# Patient Record
Sex: Female | Born: 1965 | Race: Black or African American | Hispanic: No | Marital: Married | Smoking: Former smoker
Health system: Southern US, Community
[De-identification: ages and names within clinical notes are randomized; demographics above are authoritative.]

## PROBLEM LIST (undated history)

## (undated) DIAGNOSIS — E049 Nontoxic goiter, unspecified: Secondary | ICD-10-CM

## (undated) DIAGNOSIS — E042 Nontoxic multinodular goiter: Secondary | ICD-10-CM

## (undated) DIAGNOSIS — E669 Obesity, unspecified: Secondary | ICD-10-CM

## (undated) DIAGNOSIS — Z720 Tobacco use: Secondary | ICD-10-CM

## (undated) DIAGNOSIS — R079 Chest pain, unspecified: Secondary | ICD-10-CM

## (undated) DIAGNOSIS — IMO0002 Reserved for concepts with insufficient information to code with codable children: Secondary | ICD-10-CM

## (undated) DIAGNOSIS — M25751 Osteophyte, right hip: Secondary | ICD-10-CM

## (undated) DIAGNOSIS — M5431 Sciatica, right side: Secondary | ICD-10-CM

## (undated) DIAGNOSIS — F17200 Nicotine dependence, unspecified, uncomplicated: Secondary | ICD-10-CM

## (undated) DIAGNOSIS — E66811 Obesity, class 1: Secondary | ICD-10-CM

## (undated) DIAGNOSIS — M65332 Trigger finger, left middle finger: Secondary | ICD-10-CM

## (undated) DIAGNOSIS — R011 Cardiac murmur, unspecified: Secondary | ICD-10-CM

## (undated) DIAGNOSIS — IMO0001 Reserved for inherently not codable concepts without codable children: Secondary | ICD-10-CM

## (undated) DIAGNOSIS — E041 Nontoxic single thyroid nodule: Secondary | ICD-10-CM

## (undated) DIAGNOSIS — E119 Type 2 diabetes mellitus without complications: Secondary | ICD-10-CM

## (undated) DIAGNOSIS — E7800 Pure hypercholesterolemia, unspecified: Secondary | ICD-10-CM

## (undated) DIAGNOSIS — I10 Essential (primary) hypertension: Secondary | ICD-10-CM

## (undated) DIAGNOSIS — E079 Disorder of thyroid, unspecified: Secondary | ICD-10-CM

## (undated) DIAGNOSIS — E559 Vitamin D deficiency, unspecified: Secondary | ICD-10-CM

## (undated) DIAGNOSIS — R030 Elevated blood-pressure reading, without diagnosis of hypertension: Secondary | ICD-10-CM

## (undated) DIAGNOSIS — E1165 Type 2 diabetes mellitus with hyperglycemia: Secondary | ICD-10-CM

## (undated) DIAGNOSIS — M65312 Trigger thumb, left thumb: Secondary | ICD-10-CM

## (undated) HISTORY — DX: Osteophyte, right hip: M25.751

## (undated) HISTORY — PX: FACIAL COSMETIC SURGERY: SHX629

## (undated) HISTORY — DX: Chest pain, unspecified: R07.9

## (undated) HISTORY — DX: Sciatica, right side: M54.31

## (undated) HISTORY — DX: Nontoxic goiter, unspecified: E04.9

## (undated) HISTORY — DX: Reserved for inherently not codable concepts without codable children: IMO0001

## (undated) HISTORY — DX: Tobacco use: Z72.0

## (undated) HISTORY — DX: Obesity, class 1: E66.811

## (undated) HISTORY — DX: Type 2 diabetes mellitus with hyperglycemia: E11.65

## (undated) HISTORY — DX: Nontoxic multinodular goiter: E04.2

## (undated) HISTORY — DX: Essential (primary) hypertension: I10

## (undated) HISTORY — DX: Disorder of thyroid, unspecified: E07.9

## (undated) HISTORY — DX: Obesity, unspecified: E66.9

## (undated) HISTORY — DX: Reserved for concepts with insufficient information to code with codable children: IMO0002

## (undated) HISTORY — DX: Vitamin D deficiency, unspecified: E55.9

## (undated) HISTORY — DX: Nontoxic single thyroid nodule: E04.1

## (undated) HISTORY — DX: Pure hypercholesterolemia, unspecified: E78.00

## (undated) HISTORY — DX: Cardiac murmur, unspecified: R01.1

## (undated) HISTORY — PX: ECTOPIC PREGNANCY SURGERY: SHX613

## (undated) HISTORY — DX: Elevated blood-pressure reading, without diagnosis of hypertension: R03.0

---
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
## 2012-11-15 ENCOUNTER — Encounter (HOSPITAL_COMMUNITY): Payer: Self-pay | Admitting: Emergency Medicine

## 2012-11-15 ENCOUNTER — Emergency Department (HOSPITAL_COMMUNITY)
Admission: EM | Admit: 2012-11-15 | Discharge: 2012-11-15 | Disposition: A | Discharge status: Home / Self Care | Payer: Medicaid - Out of State | Attending: Emergency Medicine | Admitting: Emergency Medicine

## 2012-11-15 DIAGNOSIS — M62830 Muscle spasm of back: Secondary | ICD-10-CM

## 2012-11-15 DIAGNOSIS — F17200 Nicotine dependence, unspecified, uncomplicated: Secondary | ICD-10-CM | POA: Insufficient documentation

## 2012-11-15 DIAGNOSIS — M5380 Other specified dorsopathies, site unspecified: Secondary | ICD-10-CM | POA: Insufficient documentation

## 2012-11-15 MED ORDER — HYDROCODONE-ACETAMINOPHEN 5-325 MG PO TABS: 1.0000 | ORAL_TABLET | ORAL | Status: DC | PRN

## 2012-11-15 MED ORDER — DIAZEPAM 5 MG PO TABS: 5.0000 mg | ORAL_TABLET | Freq: Four times a day (QID) | ORAL | Status: DC | PRN

## 2012-11-15 NOTE — ED Notes (Signed)
PT. REPORTS INTERMITTENT LOW BACK  PAIN FOR 2 DAYS AFTER LIFTING A SUITCASE , DENIES FALL , AMBULATORY , NO URINARY DISCOMFORT OR HEMATURIA . PAIN WORSE WITH MOVEMENT / CERTAIN POSITIONS .

## 2012-11-15 NOTE — ED Provider Notes (Signed)
CSN: [id number]     Arrival date & time 11/15/12  1851 History   First MD Initiated Contact with Patient 11/15/12 2010     Chief Complaint  Patient presents with  . Back Pain   HPI  History provided by the patient. The patient is a 47 year old female with no significant PMH who presents with complaints of right lower back pain for the past 2 days. Patient states she had some increased activity with traveling and packing. She was having some soreness in her back but nothing significant. 2 days ago she was lifting her suitcase and twisting slightly with a much sharper pain in "twinge"in her lower back more on the right side. Since then she has had worsening pains with frequent sharp intense pains during movements and positions. She states it makes it very difficult to move and function. If she was using ibuprofen and has used topical icy hot patches. These initially helped briefly but she is not having any relief now. Pain does not radiate. Denies any weakness or numbness in lower extremities. No urinary or fecal incontinence, urinary retention or perineal numbness. No other aggravating or alleviating factors. No other associated symptoms.    History reviewed. No pertinent past medical history. Past Surgical History  Procedure Laterality Date  . Facial cosmetic surgery     No family history on file. History  Substance Use Topics  . Smoking status: Current Every Day Smoker  . Smokeless tobacco: Not on file  . Alcohol Use: No   OB History   Grav Para Term Preterm Abortions TAB SAB Ect Mult Living                 Review of Systems  Constitutional: Negative for fever and unexpected weight change.  Respiratory: Negative for shortness of breath.   Cardiovascular: Negative for chest pain.  Gastrointestinal: Negative for abdominal pain.  Genitourinary: Negative for dysuria, frequency, hematuria and flank pain.  Musculoskeletal: Positive for back pain.  Neurological: Negative for weakness  and numbness.  All other systems reviewed and are negative.    Allergies  Review of patient's allergies indicates not on file.  Home Medications   Current Outpatient Rx  Name  Route  Sig  Dispense  Refill  . diazepam (VALIUM) 5 MG tablet   Oral   Take 1 tablet (5 mg total) by mouth every 6 (six) hours as needed for anxiety (spasms).   10 tablet   0   . HYDROcodone-acetaminophen (NORCO) 5-325 MG per tablet   Oral   Take 1 tablet by mouth every 4 (four) hours as needed for pain.   10 tablet   0    BP 126/72  Pulse 75  Temp(Src) 97.9 F (36.6 C) (Oral)  Resp 16  SpO2 97% Physical Exam  Nursing note and vitals reviewed. Constitutional: She is oriented to person, place, and time. She appears well-developed and well-nourished. No distress.  HENT:  Head: Normocephalic.  Cardiovascular: Normal rate and regular rhythm.   Pulmonary/Chest: Effort normal and breath sounds normal. No respiratory distress. She has no wheezes. She has no rales.  Abdominal: Soft. There is no tenderness. There is no rebound and no guarding.  Musculoskeletal: Normal range of motion. She exhibits no edema and no tenderness.       Lumbar back: She exhibits no bony tenderness and no deformity.       Back:  Negative straight leg test  Neurological: She is alert and oriented to person, place, and time.  Skin: Skin is warm and dry. No rash noted.  Psychiatric: She has a normal mood and affect. Her behavior is normal.    ED Course  Procedures  Labs Review Labs Reviewed - No data to display Imaging Review No results found.  MDM   1. Muscle spasm of back    Patient seen and evaluated. The patient appears well in no acute distress. Does not appear in significant pain or discomfort. Patient with no red flag symptoms. No concerning injury or trauma. No indications for imaging at this time. Symptoms most consistent with muscle strain and intermittent spasms    Angus Seller, PA-C 11/15/12 2219

## 2012-11-16 NOTE — ED Provider Notes (Signed)
Medical screening examination/treatment/procedure(s) were performed by non-physician practitioner and as supervising physician I was immediately available for consultation/collaboration.   Audree Camel, MD 11/16/12 [phone]

## 2014-01-17 ENCOUNTER — Encounter (HOSPITAL_COMMUNITY): Payer: Self-pay | Admitting: Emergency Medicine

## 2014-01-17 ENCOUNTER — Emergency Department (HOSPITAL_COMMUNITY)
Admission: EM | Admit: 2014-01-17 | Discharge: 2014-01-17 | Disposition: A | Discharge status: Home / Self Care | Payer: No Typology Code available for payment source | Source: Home / Self Care | Attending: Family Medicine | Admitting: Family Medicine

## 2014-01-17 DIAGNOSIS — B370 Candidal stomatitis: Secondary | ICD-10-CM

## 2014-01-17 MED ORDER — NYSTATIN 100000 UNIT/ML MT SUSP: 500000.0000 [IU] | Freq: Four times a day (QID) | OROMUCOSAL | Status: DC

## 2014-01-17 NOTE — Discharge Instructions (Signed)
Thrush, Adult  °Thrush, also called oral candidiasis, is a fungal infection that develops in the mouth and throat and on the tongue. It causes white patches to form on the mouth and tongue. Thrush is most common in older adults, but it can occur at any age.  °Many cases of thrush are mild, but this infection can also be more serious. Thrush can be a recurring problem for people who have chronic illnesses or who take medicines that limit the body's ability to fight infection. Because these people have difficulty fighting infections, the fungus that causes thrush can spread throughout the body. This can cause life-threatening blood or organ infections. °CAUSES  °Thrush is usually caused by a yeast called Candida albicans. This fungus is normally present in small amounts in the mouth and on other mucous membranes. It usually causes no harm. However, when conditions are present that allow the fungus to grow uncontrolled, it invades surrounding tissues and becomes an infection. Less often, other Candida species can also lead to thrush.  °RISK FACTORS °Thrush is more likely to develop in the following people: °· People with an impaired ability to fight infection (weakened immune system).   °· Older adults.   °· People with HIV.   °· People with diabetes.   °· People with dry mouth (xerostomia).   °· Pregnant women.   °· People with poor dental care, especially those who have false teeth.   °· People who use antibiotic medicines.   °SIGNS AND SYMPTOMS  °Thrush can be a mild infection that causes no symptoms. If symptoms develop, they may include:  °· A burning feeling in the mouth and throat. This can occur at the start of a thrush infection.   °· White patches that adhere to the mouth and tongue. The tissue around the patches may be red, raw, and painful. If rubbed (during tooth brushing, for example), the patches and the tissue of the mouth may bleed easily.   °· A bad taste in the mouth or difficulty tasting foods.    °· Cottony feeling in the mouth.   °· Pain during eating and swallowing. °DIAGNOSIS  °Your health care provider can usually diagnose thrush by looking in your mouth and asking you questions about your health.  °TREATMENT  °Medicines that help prevent the growth of fungi (antifungals) are the standard treatment for thrush. These medicines are either applied directly to the affected area (topical) or swallowed (oral). The treatment will depend on the severity of the condition.  °Mild Thrush °Mild cases of thrush may clear up with the use of an antifungal mouth rinse or lozenges. Treatment usually lasts about 14 days.  °Moderate to Severe Thrush °· More severe thrush infections that have spread to the esophagus are treated with an oral antifungal medicine. A topical antifungal medicine may also be used.   °· For some severe infections, a treatment period longer than 14 days may be needed.   °· Oral antifungal medicines are almost never used during pregnancy because the fetus may be harmed. However, if a pregnant woman has a rare, severe thrush infection that has spread to her blood, oral antifungal medicines may be used. In this case, the risk of harm to the mother and fetus from the severe thrush infection may be greater than the risk posed by the use of antifungal medicines.   °Persistent or Recurrent Thrush °For cases of thrush that do not go away or keep coming back, treatment may involve the following:  °· Treatment may be needed twice as long as the symptoms last.   °· Treatment will   include both oral and topical antifungal medicines.   People with weakened immune systems can take an antifungal medicine on a continuous basis to prevent thrush infections.  It is important to treat conditions that make you more likely to get thrush, such as diabetes or HIV.  HOME CARE INSTRUCTIONS   Only take over-the-counter or prescription medicine as directed by your health care provider. Talk to your health care  provider about an over-the-counter medicine called gentian violet, which kills bacteria and fungi.   Eat plain, unflavored yogurt as directed by your health care provider. Check the label to make sure the yogurt contains live cultures. This yogurt can help healthy bacteria grow in the mouth that can stop the growth of the fungus that causes thrush.   Try these measures to help reduce the discomfort of thrush:   Drink cold liquids such as water or iced tea.   Try flavored ice treats or frozen juices.   Eat foods that are easy to swallow, such as gelatin, ice cream, or custard.   If the patches in your mouth are painful, try drinking from a straw.   Rinse your mouth several times a day with a warm saltwater rinse. You can make the saltwater mixture with 1 tsp (6 g) of salt in 8 fl oz (0.2 L) of warm water.   If you wear dentures, remove the dentures before going to bed, brush them vigorously, and soak them in a cleaning solution as directed by your health care provider.   Women who are breastfeeding should clean their nipples with an antifungal medicine as directed by their health care provider. Dry the nipples after breastfeeding. Applying lanolin-containing body lotion may help relieve nipple soreness.  SEEK MEDICAL CARE IF:  Your symptoms are getting worse or are not improving within 7 days of starting treatment.   You have symptoms of spreading infection, such as white patches on the skin outside of the mouth.   You are nursing and you have redness, burning, or pain in the nipples that is not relieved with treatment.  MAKE SURE YOU:  Understand these instructions.  Will watch your condition.  Will get help right away if you are not doing well or get worse. Document Released: 12/02/2003 Document Revised: 12/27/2012 Document Reviewed: 10/09/2012 [facility] Patient Information 2015 [place], [place]. This information is not intended to replace advice given to you by your  health care provider. Make sure you discuss any questions you have with your health care provider.  Stomatitis Stomatitis is an inflammation of the mucous lining of the mouth. It can affect part of the mouth or the whole mouth. The intensity of symptoms can range from mild to severe. It can affect your cheek, teeth, gums, lips, or tongue. In almost all cases, the lining of the mouth becomes swollen, red, and painful. Painful ulcers can develop in your mouth. Stomatitis recurs in some people. CAUSES  There are many common causes of stomatitis. They include:  Viruses (such as cold sores or shingles).  Canker sores.  Bacteria (such as ulcerative gingivitis or sexually transmitted diseases).  Fungus or yeast (such as candidiasis or oral thrush).  Poor oral hygiene and poor nutrition (Vincent's stomatitis or trench mouth).  Lack of vitamin B, vitamin C, or niacin.  Dentures or braces that do not fit properly.  High acid foods (uncommon).  Sharp or broken teeth.  Cheek biting.  Breathing through the mouth.  Chewing tobacco.  Allergy to toothpaste, mouthwash, candy, gum, lipstick, or  some medicines.  Burning your mouth with hot drinks or food.  Exposure to dyes, heavy metals, acid fumes, or mineral dust. SYMPTOMS   Painful ulcers in the mouth.  Blisters in the mouth.  Bleeding gums.  Swollen gums.  Irritability.  Bad breath.  Bad taste in the mouth.  Fever.  Trouble eating because of burning and pain in the mouth. DIAGNOSIS  Your caregiver will examine your mouth and look for bleeding gums and mouth ulcers. Your caregiver may ask you about the medicines you are taking. Your caregiver may suggest a blood test and tissue sample (biopsy) of the mouth ulcer or mass if either is present. This will help find the cause of your condition. TREATMENT  Your treatment will depend on the cause of your condition. Your caregiver will first try to treat your symptoms.   You may  be given pain medicine. Topical anesthetic may be used to numb the area if you have severe pain.  Your caregiver may prescribe antibiotic medicine if you have a bacterial infection.  Your caregiver may prescribe antifungal medicine if you have a fungal infection.  You may need to take antiviral medicine if you have a viral infection like herpes.  You may be asked to use medicated mouth rinses.  Your caregiver will advise you about proper brushing and using a soft toothbrush. You also need to get your teeth cleaned regularly. HOME CARE INSTRUCTIONS   Maintain good oral hygiene. This is especially important for transplant patients.  Brush your teeth carefully with a soft, nylon-bristled toothbrush.  Floss at least 2 times a day.  Clean your mouth after eating.  Rinse your mouth with salt water 3 to 4 times a day.  Gargle with cold water.  Use topical numbing medicines to decrease pain if recommended by your caregiver.  Stop smoking, and stop using chewing or smokeless tobacco.  Avoid eating hot and spicy foods.  Eat soft and bland food.  Reduce your stress wherever possible.  Eat healthy and nutritious foods. SEEK MEDICAL CARE IF:   Your symptoms persist or get worse.  You develop new symptoms.  Your mouth ulcers are present for more than 3 weeks.  Your mouth ulcers come back frequently.  You have increasing difficulty with normal eating and drinking.  You have increasing fatigue or weakness.  You develop loss of appetite or nausea. SEEK IMMEDIATE MEDICAL CARE IF:   You have a fever.  You develop pain, redness, or sores around one or both eyes.  You cannot eat or drink because of pain or other symptoms.  You develop worsening weakness, or you faint.  You develop vomiting or diarrhea.  You develop chest pain, shortness of breath, or rapid and irregular heartbeats. MAKE SURE YOU:  Understand these instructions.  Will watch your condition.  Will get  help right away if you are not doing well or get worse. Document Released: 01/03/2007 Document Revised: 05/31/2011 Document Reviewed: 10/15/2010 [facility] Patient Information 2015 [place], [place]. This information is not intended to replace advice given to you by your health care provider. Make sure you discuss any questions you have with your health care provider.

## 2014-01-17 NOTE — ED Provider Notes (Signed)
CSN: [id number]     Arrival date & time 01/17/14  1015 History   First MD Initiated Contact with Patient 01/17/14 1056     Chief Complaint  Patient presents with  . Oral Swelling   (Consider location/radiation/quality/duration/timing/severity/associated sxs/prior Treatment) HPI Comments: 3 days of gumline and upper palate discomfort and irritation. Wears partial upper denture plate and patient states it has been quite uncomfortable for her to wear her dentures. Denies fever, swelling. State she is aware that she has elevated blood pressure and is scheduled to see her PCP on 02/26/2014 to address.  PCP: [facility] Dentist: none  The history is provided by the patient.    History reviewed. No pertinent past medical history. Past Surgical History  Procedure Laterality Date  . Facial cosmetic surgery     History reviewed. No pertinent family history. History  Substance Use Topics  . Smoking status: Current Every Day Smoker  . Smokeless tobacco: Not on file  . Alcohol Use: Yes   OB History   Grav Para Term Preterm Abortions TAB SAB Ect Mult Living                 Review of Systems  All other systems reviewed and are negative.   Allergies  Review of patient's allergies indicates no known allergies.  Home Medications   Prior to Admission medications   Medication Sig Start Date End Date Taking? Authorizing Provider  aspirin 81 MG tablet Take 81 mg by mouth daily.   Yes Historical Provider, MD  diazepam (VALIUM) 5 MG tablet Take 1 tablet (5 mg total) by mouth every 6 (six) hours as needed for anxiety (spasms). 11/15/12   Martie Lee, PA-C  HYDROcodone-acetaminophen (NORCO) 5-325 MG per tablet Take 1 tablet by mouth every 4 (four) hours as needed for pain. 11/15/12   Ruthell Rummage Dammen, PA-C  nystatin (MYCOSTATIN) 100000 UNIT/ML suspension Take 5 mLs (500,000 Units total) by mouth 4 (four) times daily. Swish and swallow X 10 days 01/17/14   Annett Gula H Presson, PA   BP 159/95  Pulse  80  Temp(Src) 98.4 F (36.9 C) (Oral)  Resp 12  SpO2 100% Physical Exam  Nursing note and vitals reviewed. Constitutional: She is oriented to person, place, and time. She appears well-developed and well-nourished. No distress.  HENT:  Head: Normocephalic and atraumatic.  Erythematous patchy excoriations with white exudate on upper palate.   Eyes: Conjunctivae are normal. No scleral icterus.  Neck: Normal range of motion. Neck supple.  Cardiovascular: Normal rate.   Pulmonary/Chest: Effort normal. No stridor.  Lymphadenopathy:    She has no cervical adenopathy.  Neurological: She is alert and oriented to person, place, and time.  Skin: Skin is warm and dry.  Psychiatric: She has a normal mood and affect. Her behavior is normal.    ED Course  Procedures (including critical care time) Labs Review Labs Reviewed - No data to display  Imaging Review No results found.   MDM   1. Thrush   Nystatin swish/swallow as prescribed with follow up if no improvement. Clean dentures thouroughly  Lutricia Feil, PA 01/17/14 [place], [place] 01/17/14 [phone]

## 2014-01-17 NOTE — ED Notes (Signed)
Reports 3 to 4 days of gum swelling.  States "I have dentures".   Pt has done salt water gargles with no relief.

## 2014-02-26 ENCOUNTER — Encounter: Payer: Self-pay | Admitting: Family Medicine

## 2014-02-26 ENCOUNTER — Ambulatory Visit (INDEPENDENT_AMBULATORY_CARE_PROVIDER_SITE_OTHER): Payer: No Typology Code available for payment source | Admitting: Family Medicine

## 2014-02-26 VITALS — BP 120/88 | HR 85 | Temp 98.8°F | Ht 65.0 in | Wt 180.4 lb

## 2014-02-26 DIAGNOSIS — Z7189 Other specified counseling: Secondary | ICD-10-CM

## 2014-02-26 DIAGNOSIS — Z7689 Persons encountering health services in other specified circumstances: Secondary | ICD-10-CM

## 2014-02-26 DIAGNOSIS — F17200 Nicotine dependence, unspecified, uncomplicated: Secondary | ICD-10-CM

## 2014-02-26 DIAGNOSIS — IMO0001 Reserved for inherently not codable concepts without codable children: Secondary | ICD-10-CM

## 2014-02-26 DIAGNOSIS — R030 Elevated blood-pressure reading, without diagnosis of hypertension: Secondary | ICD-10-CM

## 2014-02-26 DIAGNOSIS — E042 Nontoxic multinodular goiter: Secondary | ICD-10-CM

## 2014-02-26 DIAGNOSIS — Z720 Tobacco use: Secondary | ICD-10-CM

## 2014-02-26 HISTORY — DX: Nicotine dependence, unspecified, uncomplicated: F17.200

## 2014-02-26 MED ORDER — BUPROPION HCL ER (SR) 150 MG PO TB12: 150.0000 mg | ORAL_TABLET | Freq: Every day | ORAL | Status: DC

## 2014-02-26 NOTE — Progress Notes (Signed)
HPI:  Cindy Clark is here to establish care. Recently moved to [place].  Last PCP and physical: has been 3-4 years  Has the following chronic problems that require follow up and concerns today:  Tobacco use: -wants to quit because: hates the smell, has been smoking too long, is tired of going outside -she wants to be healthy -she has tried to quit with nicotine and failed -has tried to quit with nicotine -smokes 1ppd -denies: cough, SOb, weight loss, hemoptysis, hx depression -reports prior doc told her to take asa daily due to smoking  Hx of elevated Blood Pressure: -never on medications -denies: CP, SOB, swelling  Hx ? Thyroid nodules: -reports saw endocrinologist and had biopsy and told fine (Dr. Vernell Barrier)  ROS negative for unless reported above: fevers, unintentional weight loss, hearing or vision loss, chest pain, palpitations, struggling to breath, hemoptysis, melena, hematochezia, hematuria, falls, loc, si, thoughts of self harm  Past Medical History  Diagnosis Date  . Heart murmur     when pregnant - told was normal  . Thyroid disease     reports seen by endocrinologist in the past for thyroid nodules, biopsy and told benign and told nothing further needed  . Elevated blood pressure   . Tobacco use     Past Surgical History  Procedure Laterality Date  . Facial cosmetic surgery    . Facial cosmetic surgery    . Ectopic pregnancy surgery      Family History  Problem Relation Age of Onset  . Heart disease Mother 52    MI  . Hypertension Mother   . Diabetes Brother   . Hypertension Brother     History   Social History  . Marital Status: Single    Spouse Name: N/A    Number of Children: N/A  . Years of Education: N/A   Social History Main Topics  . Smoking status: Current Every Day Smoker -- 1.00 packs/day for 30 years  . Smokeless tobacco: None     Comment: used to be light smoker but ppd since 2012  . Alcohol Use: No  . Drug Use: No  .  Sexual Activity: None   Other Topics Concern  . 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  GENERAL: vitals reviewed and listed above, alert, oriented, appears well hydrated and in no acute distress  HEENT: atraumatic, conjunttiva clear, no obvious abnormalities on inspection of external nose and ears  NECK: no obvious masses on inspection  LUNGS: clear to auscultation bilaterally, no wheezes, rales or rhonchi, good air movement  CV: HRRR, no peripheral edema  MS: moves all extremities without noticeable abnormality  PSYCH: pleasant and cooperative, no obvious depression or anxiety  ASSESSMENT AND PLAN:  Discussed the following assessment and plan:  Tobacco use disorder -  Plan: buPROPion (WELLBUTRIN SR) 150 MG 12 hr tablet -smoking cessation counseling for > 10 minutes -discussed smoking risks, advised to quit, discussed motivation and desire to quit, reasons to quit, withdrawal symptoms and options for helping her quit. She opted to try wellbutrin with a quit date in 1 week and has opted to do walking and chewing gum when has cravings. Follow up in 1 month.  Elevated blood pressure -BP great today, monitor  Multiple thyroid nodules -we have asked her to bring records to her next visit to review  Visit to establish care -We reviewed the PMH, PSH, FH, SH, Meds and Allergies. -We provided refills for any medications we will prescribe as  needed. -We addressed current concerns per orders and patient instructions. -We have asked for records for pertinent exams, studies, vaccines and notes from previous providers. -We have advised patient to follow up per instructions below. -needs physical, but she has opted to do this in a few months after she quits smoking, advised she schedule this now  -Patient advised to return or notify a doctor immediately if symptoms worsen or persist or new concerns arise.  Patient Instructions  BEFORE YOU LEAVE: -schedule follow up for smoking in 1  Month -schedule physical in 3 months - morning appointment, come fasting for this   FOR THE SMOKING: -start the wellbutrin tomorrow - take once daily -QUIT date: Tuesday December 16th 2015 -in preparation start to cut back on cigarettes this week -get rid of all cigarettes prior to quit date -activity to do when you have cravings: gum and walk (REGARDLESS of the weather!)  Please bring your records from your endocrinologist to your next visit ([address])     [place], [place].

## 2014-02-26 NOTE — Progress Notes (Signed)
Pre visit review using our clinic review tool, if applicable. No additional management support is needed unless otherwise documented below in the visit note. 

## 2014-02-26 NOTE — Patient Instructions (Addendum)
BEFORE YOU LEAVE: -schedule follow up for smoking in 1  Month -schedule physical in 3 months - morning appointment, come fasting for this   FOR THE SMOKING: -start the wellbutrin tomorrow - take once daily -QUIT date: Tuesday December 16th 2015 -in preparation start to cut back on cigarettes this week -get rid of all cigarettes prior to quit date -activity to do when you have cravings: gum and walk (REGARDLESS of the weather!)  Please bring your records from your endocrinologist to your next visit [facility])

## 2014-02-27 ENCOUNTER — Telehealth: Payer: Self-pay | Admitting: Family Medicine

## 2014-02-27 NOTE — Telephone Encounter (Signed)
emmi emailed °

## 2014-03-05 ENCOUNTER — Ambulatory Visit (INDEPENDENT_AMBULATORY_CARE_PROVIDER_SITE_OTHER): Payer: No Typology Code available for payment source | Admitting: Family Medicine

## 2014-03-05 ENCOUNTER — Encounter: Payer: Self-pay | Admitting: Family Medicine

## 2014-03-05 ENCOUNTER — Telehealth: Payer: Self-pay | Admitting: Family Medicine

## 2014-03-05 VITALS — BP 130/76 | HR 97 | Temp 99.0°F | Ht 65.0 in | Wt 183.6 lb

## 2014-03-05 DIAGNOSIS — J069 Acute upper respiratory infection, unspecified: Secondary | ICD-10-CM

## 2014-03-05 NOTE — Patient Instructions (Signed)
INSTRUCTIONS FOR UPPER RESPIRATORY INFECTION:  -plenty of rest and fluids  -nasal saline wash 2-3 times daily (use prepackaged nasal saline or bottled/distilled water if making your own)   -clean nose with nasal saline before using the nasal steroid or sinex  -can use AFRIN nasal spray for drainage and nasal congestion - but do NOT use longer then 3-4 days  -can use tylenol or ibuprofen as directed for aches and sorethroat  -in the winter time, using a humidifier at night is helpful (please follow cleaning instructions)  -if you are taking a cough medication - use only as directed, may also try a teaspoon of honey to coat the throat and throat lozenges  -for sore throat, salt water gargles can help  -follow up if you have fevers, facial pain, tooth pain, difficulty breathing or are worsening or not getting better in 5-7 days  

## 2014-03-05 NOTE — Progress Notes (Signed)
Pre visit review using our clinic review tool, if applicable. No additional management support is needed unless otherwise documented below in the visit note. 

## 2014-03-05 NOTE — Telephone Encounter (Signed)
Worked in today 

## 2014-03-05 NOTE — Telephone Encounter (Signed)
Patient states she started having muscle body aches and throat was swollen last night.  She says the pain isn't as severe as last night but her throat is still swollen. She said this started after taking the buPROPion (WELLBUTRIN SR) 150 MG 12 hr tablet.  She wanted to know is she could take her morning dosage, I transferred her to [facility] for that advice while she waits for a returned call.

## 2014-03-05 NOTE — Progress Notes (Signed)
HPI:  -started: 2- 3 days ago -symptoms: nasal congestion - lots of clear congestion, sore throat, drainage in throat, feels like glands swollen, chills, cough, body aches -denies:  SOB, NVD, tooth pain, sinus pain swelling of mouth or neck, choking -has tried: nothing -sick contacts/travel/risks: denies flu exposure, tick exposure or or Ebola risks, strep exposure -Hx of: daughter read all of the side effects to Wellbutrin and wondered if could be reaction to this - pt thought was the flu  ROS: See pertinent positives and negatives per HPI.  Past Medical History  Diagnosis Date  . Heart murmur     when pregnant - told was normal  . Thyroid disease     reports seen by endocrinologist in the past for thyroid nodules, biopsy and told benign and told nothing further needed  . Elevated blood pressure   . Tobacco use     Past Surgical History  Procedure Laterality Date  . Facial cosmetic surgery    . Facial cosmetic surgery    . Ectopic pregnancy surgery      Family History  Problem Relation Age of Onset  . Heart disease Mother 50    MI  . Hypertension Mother   . Diabetes Brother   . Hypertension Brother     History   Social History  . Marital Status: Married    Spouse Name: N/A    Number of Children: N/A  . Years of Education: N/A   Social History Main Topics  . Smoking status: Current Every Day Smoker -- 1.00 packs/day for 30 years  . Smokeless tobacco: None     Comment: used to be light smoker but ppd since 2012  . Alcohol Use: No  . Drug Use: No  . Sexual Activity: None   Other Topics Concern  . None   Social History Narrative   Work or School: LPN school      Home Situation: lives with husband Environmental manager)      Spiritual Beliefs: believes in [place], no church      Lifestyle: no regular exercise; diet is good          Current outpatient prescriptions: aspirin 81 MG tablet, Take 81 mg by mouth daily., Disp: , Rfl:   EXAM:  Filed Vitals:   03/05/14 1456  BP: 130/76  Pulse: 97  Temp: 99 F (37.2 C)    Body mass index is 30.55 kg/(m^2).  GENERAL: vitals reviewed and listed above, alert, oriented, appears well hydrated and in no acute distress  HEENT: atraumatic, conjunttiva clear, no obvious abnormalities on inspection of external nose and ears, normal appearance of ear canals and TMs, copious amounts of clear nasal congestion, mild post oropharyngeal erythema with PND, no tonsillar edema or exudate, no sinus TTP  NECK: no obvious masses on inspection  LUNGS: clear to auscultation bilaterally, no wheezes, rales or rhonchi, good air movement  CV: HRRR, no peripheral edema  MS: moves all extremities without noticeable abnormality  PSYCH: pleasant and cooperative, no obvious depression or anxiety  ASSESSMENT AND PLAN:  Discussed the following assessment and plan:  Acute upper respiratory infection  -given HPI and exam findings today, a serious infection or illness is unlikely. We discussed potential etiologies, with VURI being most likely, and advised supportive care and monitoring. We discussed treatment side effects, likely course, antibiotic misuse, transmission, and signs of developing a serious illness. -flu test: negative -discussed not likely a side effect to medication - she opted to hold Wellbutrin  until feeling better then restart to see if tolerates -of course, we advised to return or notify a doctor immediately if symptoms worsen or persist or new concerns arise.    Patient Instructions  INSTRUCTIONS FOR UPPER RESPIRATORY INFECTION:  -plenty of rest and fluids  -nasal saline wash 2-3 times daily (use prepackaged nasal saline or bottled/distilled water if making your own)   -clean nose with nasal saline before using the nasal steroid or sinex  -can use AFRIN nasal spray for drainage and nasal congestion - but do NOT use longer then 3-4 days  -can use tylenol or ibuprofen as directed for aches and  sorethroat  -in the winter time, using a humidifier at night is helpful (please follow cleaning instructions)  -if you are taking a cough medication - use only as directed, may also try a teaspoon of honey to coat the throat and throat lozenges  -for sore throat, salt water gargles can help  -follow up if you have fevers, facial pain, tooth pain, difficulty breathing or are worsening or not getting better in 5-7 days      KIM, HANNAH R.

## 2014-03-28 ENCOUNTER — Telehealth: Payer: Self-pay | Admitting: Family Medicine

## 2014-03-28 ENCOUNTER — Ambulatory Visit (INDEPENDENT_AMBULATORY_CARE_PROVIDER_SITE_OTHER): Payer: No Typology Code available for payment source | Admitting: Family Medicine

## 2014-03-28 ENCOUNTER — Encounter: Payer: Self-pay | Admitting: Family Medicine

## 2014-03-28 VITALS — HR 94 | Temp 98.3°F | Ht 65.0 in | Wt 186.7 lb

## 2014-03-28 DIAGNOSIS — F17200 Nicotine dependence, unspecified, uncomplicated: Secondary | ICD-10-CM

## 2014-03-28 DIAGNOSIS — Z720 Tobacco use: Secondary | ICD-10-CM

## 2014-03-28 MED ORDER — VARENICLINE TARTRATE 0.5 MG X 11 & 1 MG X 42 PO MISC: ORAL | Status: DC

## 2014-03-28 MED ORDER — VARENICLINE TARTRATE 1 MG PO TABS: 1.0000 mg | ORAL_TABLET | Freq: Two times a day (BID) | ORAL | Status: DC

## 2014-03-28 NOTE — Telephone Encounter (Signed)
[phone] (home)  Talked with pt and congratulated her on desire to quit. Chantix was $132. She is going to check on cost at other pharmacies and call us.

## 2014-03-28 NOTE — Progress Notes (Signed)
HPI:  Tobacco Use: -see notes from last visit -started wellbutrin 02/2014, she then developed a cold and thought this was from the wellbutrin so held - she reports felt funny when she took it - sore throat - so stopped it -reports: smoking 1ppd, reports did not tolerate nicotine patch -wants to try chantix, has not optained a quit coach -denies: cough, SOB, hemoptysis  ROS: See pertinent positives and negatives per HPI.  Past Medical History  Diagnosis Date  . Heart murmur     when pregnant - told was normal  . Thyroid disease     reports seen by endocrinologist in the past for thyroid nodules, biopsy and told benign and told nothing further needed  . Elevated blood pressure   . Tobacco use     Past Surgical History  Procedure Laterality Date  . Facial cosmetic surgery    . Facial cosmetic surgery    . Ectopic pregnancy surgery      Family History  Problem Relation Age of Onset  . Heart disease Mother 94    MI  . Hypertension Mother   . Diabetes Brother   . Hypertension Brother     History   Social History  . Marital Status: Married    Spouse Name: N/A    Number of Children: N/A  . Years of Education: N/A   Social History Main Topics  . Smoking status: Current Every Day Smoker -- 1.00 packs/day for 30 years  . Smokeless tobacco: None     Comment: used to be light smoker but ppd since 2012  . Alcohol Use: No  . Drug Use: No  . Sexual Activity: None   Other Topics Concern  . None   Social History Narrative   Work or School: LPN school      Home Situation: lives with husband Environmental manager)      Spiritual Beliefs: believes in [place], no church      Lifestyle: no regular exercise; diet is good          Current outpatient prescriptions: aspirin 81 MG tablet, Take 81 mg by mouth daily., Disp: , Rfl: ;  varenicline (CHANTIX CONTINUING MONTH PAK) 1 MG tablet, Take 1 tablet (1 mg total) by mouth 2 (two) times daily., Disp: 60 tablet, Rfl: 1 varenicline  (CHANTIX STARTING MONTH PAK) 0.5 MG X 11 & 1 MG X 42 tablet, Take one 0.5 mg tablet by mouth once daily for 3 days, then increase to one 0.5 mg tablet twice daily for 4 days, then increase to one 1 mg tablet twice daily., Disp: 53 tablet, Rfl: 0  EXAM:  Filed Vitals:   03/28/14 1012  Pulse: 94  Temp: 98.3 F (36.8 C)    Body mass index is 31.07 kg/(m^2).  GENERAL: vitals reviewed and listed above, alert, oriented, appears well hydrated and in no acute distress  HEENT: atraumatic, conjunttiva clear, no obvious abnormalities on inspection of external nose and ears  NECK: no obvious masses on inspection  LUNGS: clear to auscultation bilaterally, no wheezes, rales or rhonchi, good air movement  CV: HRRR, no peripheral edema  MS: moves all extremities without noticeable abnormality  PSYCH: pleasant and cooperative, no obvious depression or anxiety  ASSESSMENT AND PLAN:  Discussed the following assessment and plan:  Tobacco use disorder - Plan: varenicline (CHANTIX STARTING MONTH PAK) 0.5 MG X 11 & 1 MG X 42 tablet, varenicline (CHANTIX CONTINUING MONTH PAK) 1 MG tablet  -counseled for 10 minutes -she opted for  chantix, quit line coach info provided -follow up in 1 month -Patient advised to return or notify a doctor immediately if symptoms worsen or persist or new concerns arise.  Patient Instructions  BEFORE YOU LEAVE: -schedule follow up in 1 month  Call the quit line today for a quit coach  Start the Chantix today  Quit smoking   Quit date next Friday Jan 16th  Gum or walking whenever you get cravings or irritable         KIM, HANNAH R.

## 2014-03-28 NOTE — Telephone Encounter (Signed)
Rx done and the pt is aware of this and I left a coupon savings card at the front desk for her to pick up.

## 2014-03-28 NOTE — Telephone Encounter (Signed)
Patient informed of the message below and states she is very desperate to stop smoking and questioned if there was anything else she could take?

## 2014-03-28 NOTE — Telephone Encounter (Signed)
If she thinks she had reaction to wellbutrin I do not advise restarting. Maybe she can ask her insurance company and check on cost of chantix with other pharmacies - if finds cheaper at another pharmacy we can sent new rx. She reported at her visit she is spending around $150 per month on cigarettes right now - is the chantix more then that?

## 2014-03-28 NOTE — Patient Instructions (Signed)
BEFORE YOU LEAVE: -schedule follow up in 1 month  Call the quit line today for a quit coach  Start the Chantix today  Quit smoking   Quit date next Friday Jan 16th  Gum or walking whenever you get cravings or irritable

## 2014-03-28 NOTE — Telephone Encounter (Signed)
FYI Pt can not afford chantix and will go back on Wellbutrin again  Pt not sure if wellbutrin cause the rash

## 2014-03-28 NOTE — Telephone Encounter (Signed)
Pt called and said she found out her insurance will cover  varenicline (CHANTIX CONTINUING MONTH PAK) 1 MG tablet  Pt request a new rx be sent   [place]

## 2014-04-29 ENCOUNTER — Ambulatory Visit: Payer: No Typology Code available for payment source | Admitting: Family Medicine

## 2014-05-29 ENCOUNTER — Encounter: Payer: Self-pay | Admitting: Family Medicine

## 2014-05-29 ENCOUNTER — Ambulatory Visit (INDEPENDENT_AMBULATORY_CARE_PROVIDER_SITE_OTHER): Payer: No Typology Code available for payment source | Admitting: Family Medicine

## 2014-05-29 ENCOUNTER — Other Ambulatory Visit (HOSPITAL_COMMUNITY)
Admission: RE | Admit: 2014-05-29 | Discharge: 2014-05-29 | Disposition: A | Discharge status: Home / Self Care | Payer: No Typology Code available for payment source | Source: Ambulatory Visit | Attending: Family Medicine | Admitting: Family Medicine

## 2014-05-29 VITALS — BP 126/82 | HR 64 | Temp 98.1°F | Ht 64.75 in | Wt 192.5 lb

## 2014-05-29 DIAGNOSIS — Z01419 Encounter for gynecological examination (general) (routine) without abnormal findings: Secondary | ICD-10-CM | POA: Diagnosis not present

## 2014-05-29 DIAGNOSIS — Z1151 Encounter for screening for human papillomavirus (HPV): Secondary | ICD-10-CM | POA: Diagnosis present

## 2014-05-29 DIAGNOSIS — Z0000 Encounter for general adult medical examination without abnormal findings: Secondary | ICD-10-CM

## 2014-05-29 DIAGNOSIS — Z124 Encounter for screening for malignant neoplasm of cervix: Secondary | ICD-10-CM

## 2014-05-29 DIAGNOSIS — Z23 Encounter for immunization: Secondary | ICD-10-CM

## 2014-05-29 LAB — BASIC METABOLIC PANEL
BUN: 13 mg/dL (ref 6–23)
CO2: 28 meq/L (ref 19–32)
CREATININE: 0.75 mg/dL (ref 0.40–1.20)
Calcium: 9.5 mg/dL (ref 8.4–10.5)
Chloride: 105 mEq/L (ref 96–112)
GFR: 104.76 mL/min (ref >60.00)
Glucose, Bld: 105 mg/dL — ABNORMAL HIGH (ref 70–99)
POTASSIUM: 3.7 meq/L (ref 3.5–5.1)
Sodium: 139 mEq/L (ref 135–145)

## 2014-05-29 LAB — HEMOGLOBIN A1C: HEMOGLOBIN A1C: 6.3 % (ref 4.6–6.5)

## 2014-05-29 LAB — LIPID PANEL
CHOL/HDL RATIO: 4
CHOLESTEROL: 204 mg/dL — AB (ref 0–200)
HDL: 54.9 mg/dL (ref >39.00)
LDL Cholesterol: 124 mg/dL — ABNORMAL HIGH (ref 0–99)
NonHDL: 149.1
Triglycerides: 124 mg/dL (ref 0.0–149.0)
VLDL: 24.8 mg/dL (ref 0.0–40.0)

## 2014-05-29 NOTE — Addendum Note (Signed)
Addended by: Agnes Lawrence on: 05/29/2014 09:16 AM   Modules accepted: Orders

## 2014-05-29 NOTE — Progress Notes (Signed)
Pre visit review using our clinic review tool, if applicable. No additional management support is needed unless otherwise documented below in the visit note. 

## 2014-05-29 NOTE — Progress Notes (Signed)
HPI:  Here for CPE:  -Concerns and/or follow up today:   Tobacco Use: -finally quit!!! -smoke free for 38 days -she is no longer taking the chantix -denies: cough, SOB, hemoptysis  -Diet: not great  -Exercise: no regular exercise - just started to walk  -Taking folic acid, vitamin D or calcium: no  -Diabetes and Dyslipidemia Screening: FASTING  -Hx of HTN: no  -Vaccines: Tdap; flu vaccine  -pap history: reports normal in the paps  -FDLMP: no menstrual period in 8 months  -sexual activity: yes, female partner, no new partners  -wants STI testing: no  -FH breast, colon or ovarian ca: see FH Last mammogram: 4 years ago Last colon cancer screening:  Declines colonoscopy - wants to do the stool cards  Breast Ca Risk Assessment: No immediate FH of breast or ovarian Ca  -Alcohol, Tobacco, drug use: see social history  Review of Systems - no fevers, unintentional weight loss, vision loss, hearing loss, chest pain, sob, hemoptysis, melena, hematochezia, hematuria, genital discharge, changing or concerning skin lesions, bleeding, bruising, loc, thoughts of self harm or SI  Past Medical History  Diagnosis Date  . Heart murmur     when pregnant - told was normal  . Thyroid disease     reports seen by endocrinologist in the past for thyroid nodules, biopsy and told benign and told nothing further needed  . Elevated blood pressure   . Tobacco use     Past Surgical History  Procedure Laterality Date  . Facial cosmetic surgery    . Facial cosmetic surgery    . Ectopic pregnancy surgery      Family History  Problem Relation Age of Onset  . Heart disease Mother 67    MI  . Hypertension Mother   . Diabetes Brother   . Hypertension Brother     History   Social History  . Marital Status: Married    Spouse Name: N/A  . Number of Children: N/A  . Years of Education: N/A   Social History Main Topics  . Smoking status: Former Smoker -- 1.00 packs/day for 30 years   . Smokeless tobacco: Not on file     Comment: used to be light smoker but ppd since 2012  . Alcohol Use: No  . Drug Use: No  . Sexual Activity: Not on file   Other Topics Concern  . Not on file   Social History Narrative   Work or School: LPN school      Home Situation: lives with husband Environmental manager)      Spiritual Beliefs: believes in [place], no church      Lifestyle: no regular exercise; diet is good           Current outpatient prescriptions:  .  aspirin 81 MG tablet, Take 81 mg by mouth daily., Disp: , Rfl:  .  cetirizine (ZYRTEC) 10 MG tablet, Take 10 mg by mouth as needed for allergies., Disp: , Rfl:  .  Pseudoephedrine-Ibuprofen (ADVIL COLD/SINUS PO), Take by mouth as needed., Disp: , Rfl:   EXAM:  Filed Vitals:   05/29/14 0812  BP: 126/82  Pulse: 64  Temp: 98.1 F (36.7 C)    GENERAL: vitals reviewed and listed below, alert, oriented, appears well hydrated and in no acute distress  HEENT: head atraumatic, PERRLA, normal appearance of eyes, ears, nose and mouth. moist mucus membranes.  NECK: supple, no masses or lymphadenopathy  LUNGS: clear to auscultation bilaterally, no rales, rhonchi or wheeze  CV: HRRR, no peripheral edema or cyanosis, normal pedal pulses  BREAST: normal appearance - no lesions or discharge, on palpation normal breast tissue without any suspicious masses  ABDOMEN: bowel sounds normal, soft, non tender to palpation, no masses, no rebound or guarding  GU: normal appearance of external genitalia - no lesions or masses, normal vaginal mucosa - no abnormal discharge, normal appearance of cervix - no lesions or abnormal discharge, no masses or tenderness on palpation of uterus and ovaries.  RECTAL: refused  SKIN: no rash or abnormal lesions  MS: normal gait, moves all extremities normally  NEURO: CN II-XII grossly intact, normal muscle strength and sensation to light touch on extremities  PSYCH: normal affect, pleasant and  cooperative  ASSESSMENT AND PLAN:  Discussed the following assessment and plan:  Visit for preventive health examination - Plan: Lipid panel, Hemoglobin I2L, Basic metabolic panel  -Discussed and advised all [place] preventive services health task force level A and B recommendations for age, sex and risks.  -Advised at least 150 minutes of exercise per week and a healthy diet low in saturated fats and sweets and consisting of fresh fruits and vegetables, lean meats such as fish and white chicken and whole grains.  -FASTING labs, studies and vaccines per orders this encounter  Orders Placed This Encounter  Procedures  . Lipid panel  . Hemoglobin A1c  . Basic metabolic panel    Patient advised to return to clinic immediately if symptoms worsen or persist or new concerns.  Patient Instructions  BEFORE YOU LEAVE: -labs -CPE in 1 year -stool cards -flu and Tdap  Vit D3 1000 IU daily  CALL TODAY to schedule mammogram  Complete the stool cards and return per instructions  Congratulations on quitting smoking  -We have ordered labs or studies at this visit. It can take up to 1-2 weeks for results and processing. We will contact you with instructions IF your results are abnormal. Normal results will be released to your [facility]. If you have not heard from [place] or can not find your results in [facility] in 2 weeks please contact our office.   We recommend the following healthy lifestyle measures: - eat a healthy diet consisting of lots of vegetables, fruits, beans, nuts, seeds, healthy meats such as white chicken and fish and whole grains.  - avoid fried foods, fast food, processed foods, sodas, red meet and other fattening foods.  - get a least 150 minutes of aerobic exercise per week.      Return in about 1 year (around 05/29/2015) for CPE.  Lucretia Kern.

## 2014-05-29 NOTE — Patient Instructions (Addendum)
BEFORE YOU LEAVE: -labs -CPE in 1 year -stool cards -flu and Tdap  Vit D3 1000 IU daily  CALL TODAY to schedule mammogram  Complete the stool cards and return per instructions  Congratulations on quitting smoking  -We have ordered labs or studies at this visit. It can take up to 1-2 weeks for results and processing. We will contact you with instructions IF your results are abnormal. Normal results will be released to your [facility]. If you have not heard from [place] or can not find your results in [facility] in 2 weeks please contact our office.   We recommend the following healthy lifestyle measures: - eat a healthy diet consisting of lots of vegetables, fruits, beans, nuts, seeds, healthy meats such as white chicken and fish and whole grains.  - avoid fried foods, fast food, processed foods, sodas, red meet and other fattening foods.  - get a least 150 minutes of aerobic exercise per week.

## 2014-05-30 LAB — CYTOLOGY - PAP

## 2014-08-30 ENCOUNTER — Ambulatory Visit: Payer: No Typology Code available for payment source | Admitting: Family Medicine

## 2015-03-27 ENCOUNTER — Ambulatory Visit (INDEPENDENT_AMBULATORY_CARE_PROVIDER_SITE_OTHER): Payer: BLUE CROSS/BLUE SHIELD | Admitting: Family Medicine

## 2015-03-27 ENCOUNTER — Encounter: Payer: Self-pay | Admitting: Family Medicine

## 2015-03-27 VITALS — BP 130/84 | HR 90 | Temp 98.1°F | Ht 64.75 in | Wt 200.7 lb

## 2015-03-27 DIAGNOSIS — M79604 Pain in right leg: Secondary | ICD-10-CM

## 2015-03-27 DIAGNOSIS — Z23 Encounter for immunization: Secondary | ICD-10-CM | POA: Diagnosis not present

## 2015-03-27 DIAGNOSIS — D179 Benign lipomatous neoplasm, unspecified: Secondary | ICD-10-CM

## 2015-03-27 DIAGNOSIS — M79645 Pain in left finger(s): Secondary | ICD-10-CM | POA: Diagnosis not present

## 2015-03-27 DIAGNOSIS — E669 Obesity, unspecified: Secondary | ICD-10-CM

## 2015-03-27 MED ORDER — ORLISTAT 120 MG PO CAPS: 120.0000 mg | ORAL_CAPSULE | Freq: Three times a day (TID) | ORAL | Status: DC

## 2015-03-27 NOTE — Progress Notes (Signed)
HPI:  Cindy Clark is here for an acute visit for numerous complaints:  1) L thumb pain and clicking: -intermittently for several years -reports treated by hand specialist with inj in the past and wants local referral -reports L thumb clicks and catches when she bends it and has gotten to the point where she does not bend it due to pain -denies: weakness, numbness, trauma  2)R leg pain: -started 1 year ago or maybe longer -only occurs when walking after about 1-2 blocks -entire leg feels "heavy" and she experiences pain in entire leg, starting in calf, to the point that she will "drag" her leg -denies: trauma, sig weakness, knee pain, fevers, swelling, malaise, back pain, burning pain -she thinks is claudication and wants to see specialist to evaluate -does not occur with any other activities - including her aerobic curves class, leg exercises on machines or brief jogging in place  3) lump on R arm: -noticed the last few months -not changing, no pain  4)obesity: -wants to take medication for this -reports is depressing as she feels she eats healthy and is trying to exercise  ROS: See pertinent positives and negatives per HPI.  Past Medical History  Diagnosis Date  . Heart murmur     when pregnant - told was normal  . Thyroid disease     reports seen by endocrinologist in the past for thyroid nodules, biopsy and told benign and told nothing further needed  . Elevated blood pressure   . Tobacco use     Past Surgical History  Procedure Laterality Date  . Facial cosmetic surgery    . Facial cosmetic surgery    . Ectopic pregnancy surgery      Family History  Problem Relation Age of Onset  . Heart disease Mother 36    MI  . Hypertension Mother   . Diabetes Brother   . Hypertension Brother     Social History   Social History  . Marital Status: Married    Spouse Name: N/A  . Number of Children: N/A  . Years of Education: N/A   Social History Main Topics  .  Smoking status: Former Smoker -- 1.00 packs/day for 30 years  . Smokeless tobacco: None     Comment: used to be light smoker but ppd since 2012  . Alcohol Use: No  . Drug Use: No  . Sexual Activity: Not Asked   Other Topics Concern  . None   Social History Narrative   Work or School: LPN school      Home Situation: lives with husband Environmental manager)      Spiritual Beliefs: believes in [employer], no church      Lifestyle: no regular exercise; diet is good           Current outpatient prescriptions:  .  orlistat (XENICAL) 120 MG capsule, Take 1 capsule (120 mg total) by mouth 3 (three) times daily with meals., Disp: 60 capsule, Rfl: 3  EXAM:  Filed Vitals:   03/27/15 0939  BP: 130/84  Pulse: 90  Temp: 98.1 F (36.7 C)    Body mass index is 33.64 kg/(m^2).  GENERAL: vitals reviewed and listed above, alert, oriented, appears well hydrated and in no acute distress  HEENT: atraumatic, conjunttiva clear, no obvious abnormalities on inspection of external nose and ears  NECK: no obvious masses on inspection  LUNGS: clear to auscultation bilaterally, no wheezes, rales or rhonchi, good air movement  CV: HRRR, no peripheral edema, no LE  edema  MS: moves all extremities without noticeable abnormality - except refuses to bend L thumb - reports because with causes pain, does seem to catch when bends with assistance, no appreciable swelling, redness, weakness, numbness; had her walk for some time in clinic to try to pinpoint leg issues as no pain or TTP or abnormal findings on MS/NV exam of LEs. She reports calf pain and then heavy feeling after walking for several minutes.  PSYCH: pleasant and cooperative, no obvious depression or anxiety  ASSESSMENT AND PLAN:  Discussed the following assessment and plan:  Thumb pain, left - Plan: Ambulatory referral to Sports Medicine -referral to sports med per her request - likely tenosynovitis vs OA  Right leg pain - Plan: Ambulatory  referral to Vascular Surgery -vasc eval for odd symptoms; dvt unlikely but she opts for vascular eval for this and claudication -MS issue or radicular symptoms less likely given exam and hx  Lipoma -likely, discussed potential etiologies and opted to observe  Obesity - Plan: orlistat (XENICAL) 120 MG capsule -lifestyle recs -needs regular follow up and will plan labs including TSH fro next visit  -Patient advised to return or notify a doctor immediately if symptoms worsen or persist or new concerns arise.  Patient Instructions  BEFORE YOU LEAVE: -schedule follow up in 1 month  -We placed a referral for you as discussed regarding the thumb pain and the leg pain. It usually takes about 1-2 weeks to process and schedule this referral. If you have not heard from [place] regarding this appointment in 2 weeks please contact our office.  Treatment Options for Obesity:  A comprehensive exercise and diet program are advised and are the initial and longterm/lifelong treatment for obesity.  This is a long and initially often difficult process and it will often take years to reach optimal health. I recommend at least 6 months of aggressive lifestyle changes prior to initiating other, more risky treatments for obesity.  As you get healthier you will build extra blood volume and lean tissue that is heavier then fatty tissue and you will hit plateaus in your weight - thus I advise that you NOT monitor your weight on a scale. Instead pay attention to what you do each day.  The safest and healthiest way to improve you physical and mental health is to: - eat a healthy diet consisting of lots of vegetables, fruits, beans, nuts, seeds, healthy meats such as white chicken and fish and whole grains.  - avoid fried foods, fast food, processed foods, sodas, red meet and other fattening foods.  - get a least 150-300 minutes of aerobic exercise per week.  -reduce stress - counseling, meditation, relaxation to  balance other aspects of your life   Medication for Obesity: IF BMI >30 and you have not achieved weight loss through diet and exercise alone, we suggest pharmacologic therapy can be added to diet and exercise.  First Choice:  1)Orlistat (Xenical): -likely the safest in terms of heart and vascular side effects and helps cholesterol - usually recommended for 2 - 4 years -common and or serious side effects include but are not limited to: anaphylaxis, liver toxicity, gas, oily stools, loose stools, fatty stools -150 mg up to 3 times daily only during meals containing fat, diet should be <30% fat -not recommended in: pregnancy, malabsorption, some kidney stones, anorexia or bulemia history  2)Lorcaserin (Belviq) -no long term safety data -to be discontinued if do not achieve 5% body weight reduction in 12 weeks -common  and or serious side effects include but are not limited to: heart disease, anemia, abuse and dependency, depression, suicide, nausea and vomiting, headaches, low blood sugar, dizziness, anemia, fatigue, diarrhea, urinary tract infections, back pain, dry mouth, pain, cognitive impairment 10mg  twice daily -not recommended if pregnant, kidney disease, heart disease, diabetes, depression -I do not advise long term and advise weaning off if not effective and after 1-2 years  3)combination phentermine-topiramate (Qsymia)- for men or post menopausal women or monthly pregnancy test: -can cause fetal anomalies -to be discontinued GRADUALLY if do not achieve 5% body weight reduction in 12 weeks -adverse: -not recommended if: heart disease, breastfeeding, pregnant, hyperthyroid, glaucoma, drug abuse history, renal or liver problems, alcohol use, depression -common and or serious side effects include but are not limited to: metabolic acidosis, kidney stones, bone issues, electrolyte issues, heart attack, suicide, psychosis, dependency and abuse, severe reaction, seizure is stop suddenly,  dry mouth, numbness, headache, blurred vision, diarrhea, fatigue, depression, anxiety, pain, poor focus, acid reflux, dry eyes, eye pain, heart arhythmia -I do not advise long term and advise weaning of if not effective and in 1-2 years  If diabetes:  1) Metformin, orlistat or  2)Liraglutide (Victoza) -can cause t cell tumors, anaphylaxis, kidney toxicity, pancreatitis, nausea, vomiting, diarrhea, headache -injected:0.6mg  daily for 1 week, then 1.2mg  daily  Surgery is also an option and [place] has many useful resources if you are considering this.  Please check out this website if you wish:  FactoringRate.[place]        Ching Rabideau R.

## 2015-03-27 NOTE — Progress Notes (Signed)
Pre visit review using our clinic review tool, if applicable. No additional management support is needed unless otherwise documented below in the visit note. 

## 2015-03-27 NOTE — Patient Instructions (Signed)
BEFORE YOU LEAVE: -schedule follow up in 1 month  -We placed a referral for you as discussed regarding the thumb pain and the leg pain. It usually takes about 1-2 weeks to process and schedule this referral. If you have not heard from [place] regarding this appointment in 2 weeks please contact our office.  Treatment Options for Obesity:  A comprehensive exercise and diet program are advised and are the initial and longterm/lifelong treatment for obesity.  This is a long and initially often difficult process and it will often take years to reach optimal health. I recommend at least 6 months of aggressive lifestyle changes prior to initiating other, more risky treatments for obesity.  As you get healthier you will build extra blood volume and lean tissue that is heavier then fatty tissue and you will hit plateaus in your weight - thus I advise that you NOT monitor your weight on a scale. Instead pay attention to what you do each day.  The safest and healthiest way to improve you physical and mental health is to: - eat a healthy diet consisting of lots of vegetables, fruits, beans, nuts, seeds, healthy meats such as white chicken and fish and whole grains.  - avoid fried foods, fast food, processed foods, sodas, red meet and other fattening foods.  - get a least 150-300 minutes of aerobic exercise per week.  -reduce stress - counseling, meditation, relaxation to balance other aspects of your life   Medication for Obesity: IF BMI >30 and you have not achieved weight loss through diet and exercise alone, we suggest pharmacologic therapy can be added to diet and exercise.  First Choice:  1)Orlistat (Xenical): -likely the safest in terms of heart and vascular side effects and helps cholesterol - usually recommended for 2 - 4 years -common and or serious side effects include but are not limited to: anaphylaxis, liver toxicity, gas, oily stools, loose stools, fatty stools -150 mg up to 3 times daily  only during meals containing fat, diet should be <30% fat -not recommended in: pregnancy, malabsorption, some kidney stones, anorexia or bulemia history  2)Lorcaserin (Belviq) -no long term safety data -to be discontinued if do not achieve 5% body weight reduction in 12 weeks -common and or serious side effects include but are not limited to: heart disease, anemia, abuse and dependency, depression, suicide, nausea and vomiting, headaches, low blood sugar, dizziness, anemia, fatigue, diarrhea, urinary tract infections, back pain, dry mouth, pain, cognitive impairment 10mg  twice daily -not recommended if pregnant, kidney disease, heart disease, diabetes, depression -I do not advise long term and advise weaning off if not effective and after 1-2 years  3)combination phentermine-topiramate (Qsymia)- for men or post menopausal women or monthly pregnancy test: -can cause fetal anomalies -to be discontinued GRADUALLY if do not achieve 5% body weight reduction in 12 weeks -adverse: -not recommended if: heart disease, breastfeeding, pregnant, hyperthyroid, glaucoma, drug abuse history, renal or liver problems, alcohol use, depression -common and or serious side effects include but are not limited to: metabolic acidosis, kidney stones, bone issues, electrolyte issues, heart attack, suicide, psychosis, dependency and abuse, severe reaction, seizure is stop suddenly, dry mouth, numbness, headache, blurred vision, diarrhea, fatigue, depression, anxiety, pain, poor focus, acid reflux, dry eyes, eye pain, heart arhythmia -I do not advise long term and advise weaning of if not effective and in 1-2 years  If diabetes:  1) Metformin, orlistat or  2)Liraglutide (Victoza) -can cause t cell tumors, anaphylaxis, kidney toxicity, pancreatitis, nausea, vomiting, diarrhea,  headache -injected:0.6mg  daily for 1 week, then 1.2mg  daily  Surgery is also an option and [place] has many useful resources if you are  considering this.  Please check out this website if you wish:  FactoringRate.ca

## 2015-04-04 ENCOUNTER — Ambulatory Visit (INDEPENDENT_AMBULATORY_CARE_PROVIDER_SITE_OTHER): Payer: BLUE CROSS/BLUE SHIELD | Admitting: Family Medicine

## 2015-04-04 ENCOUNTER — Other Ambulatory Visit (INDEPENDENT_AMBULATORY_CARE_PROVIDER_SITE_OTHER): Payer: BLUE CROSS/BLUE SHIELD

## 2015-04-04 ENCOUNTER — Encounter: Payer: Self-pay | Admitting: Family Medicine

## 2015-04-04 VITALS — BP 132/84 | HR 79 | Ht 64.0 in | Wt 201.0 lb

## 2015-04-04 DIAGNOSIS — M79645 Pain in left finger(s): Secondary | ICD-10-CM

## 2015-04-04 DIAGNOSIS — M65312 Trigger thumb, left thumb: Secondary | ICD-10-CM

## 2015-04-04 DIAGNOSIS — M65332 Trigger finger, left middle finger: Secondary | ICD-10-CM

## 2015-04-04 HISTORY — DX: Trigger finger, left middle finger: M65.332

## 2015-04-04 HISTORY — DX: Trigger thumb, left thumb: M65.312

## 2015-04-04 NOTE — Assessment & Plan Note (Signed)
Patient given injection today and tolerated the procedure very well. Same plane as the thumb. Patient declined bracing of this finger. Return to clinic 3 weeks.

## 2015-04-04 NOTE — Progress Notes (Signed)
[facility] [address], [place] [address] Phone: [phone] Subjective:    I'm seeing this patient by the request  of:  Colin Benton R., DO   CC:  Left thumb pain  [id number] Cindy Clark is a 50 y.o. female coming in with complaint of left thumb pain. Patient has a past medical history of having an injection in her thumb because it seemed to be more of a locking mechanism bowel 5 years ago. Having very similar presentation. States that from time to time it seems to get stuck in a flexed position. No numbness. It is painful to touch sometimes. It is affecting some of her daily activity. 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 Patient has not number any true injury. Rates the severity of pain when it occurs a 7 out of 10 but only last seconds. More concerned because is seems to be locking more in the morning and more frequently.  Patient is also having very similar presentation of the third finger on the same hand. Not as bad. Not as frequent. Still same similar presentation with no numbness or weakness.     Past Medical History  Diagnosis Date  . Heart murmur     when pregnant - told was normal  . Thyroid disease     reports seen by endocrinologist in the past for thyroid nodules, biopsy and told benign and told nothing further needed  . Elevated blood pressure   . Tobacco use    Past Surgical History  Procedure Laterality Date  . Facial cosmetic surgery    . Facial cosmetic surgery    . Ectopic pregnancy surgery     Social History   Social History  . Marital Status: Married    Spouse Name: N/A  . Number of Children: N/A  . Years of Education: N/A   Social History Main Topics  . Smoking status: Former Smoker -- 1.00 packs/day for 30 years  . Smokeless tobacco: None     Comment: used to be light smoker but ppd since 2012  . Alcohol Use: No  . Drug Use: No  . Sexual Activity: Not  Asked   Other Topics Concern  . None   Social History Narrative   Work or School: LPN school      Home Situation: lives with husband Environmental manager)      Spiritual Beliefs: believes in [employer], no church      Lifestyle: no regular exercise; diet is good         No Known Allergies Family History  Problem Relation Age of Onset  . Heart disease Mother 47    MI  . Hypertension Mother   . Diabetes Brother   . Hypertension Brother     Past medical history, social, surgical and family history all reviewed in electronic medical record.  No pertanent information unless stated regarding to the chief complaint.   Review of Systems: No headache, visual changes, nausea, vomiting, diarrhea, constipation, dizziness, abdominal pain, skin rash, fevers, chills, night sweats, weight loss, swollen lymph nodes, body aches, joint swelling, muscle aches, chest pain, shortness of breath, mood changes.   Objective Blood pressure 132/84, pulse 79, height 5\' 4"  (1.626 m), weight 201 lb (91.173 kg), SpO2 97 %.  General: No apparent distress alert and oriented x3 mood and affect normal, dressed appropriately.  HEENT: Pupils equal, extraocular movements intact  Respiratory: Patient's speak in  full sentences and does not appear short of breath  Cardiovascular: No lower extremity edema, non tender, no erythema  Skin: Warm dry intact with no signs of infection or rash on extremities or on axial skeleton.  Abdomen: Soft nontender  Neuro: Cranial nerves II through XII are intact, neurovascularly intact in all extremities with 2+ DTRs and 2+ pulses.  Lymph: No lymphadenopathy of posterior or anterior cervical chain or axillae bilaterally.  Gait normal with good balance and coordination.  MSK:  Non tender with full range of motion and good stability and symmetric strength and tone of shoulders, elbows, wrist, hip, knee and ankles bilaterally.  Hand exam shows patient's left thumb does have a trigger nodule just  proximal to the IP joint. Tender to palpation. No triggering noted. Mild positive grind test. Patient does have some mild triggering of the middle finger as well. This is at the A2 pulley. Tender to palpation. Neurovascular intact distally. Good grip strength.  Limited [place] skeletal ultrasound was performed and interpreted by Hulan Saas, M  Limited ultrasound shows the patient does have a trigger nodule within the tendon sheath of the first flexor tendon as well as the third flexor tendon. Hypoechoic changes in both these areas. Mild increase in Doppler flow. No tear of the tendon noted.  Procedure: Real-time Ultrasound Guided Injection of first trigger nodule within flexor tendon sheath left side Device: GE Logiq E  Ultrasound guided injection is preferred based studies that show increased duration, increased effect, greater accuracy, decreased procedural pain, increased response rate, and decreased cost with ultrasound guided versus blind injection.  Verbal informed consent obtained.  Time-out conducted.  Noted no overlying erythema, induration, or other signs of local infection.  Skin prepped in a sterile fashion.  Local anesthesia: Topical Ethyl chloride.  With sterile technique and under real time ultrasound guidance:  With a 25-gauge half-inch no patient was injected with a total of 0.5 mL of 0.5% Marcaine and 0.5 mL of Kenalog 40 mg/dL within the tendon sheath Completed without difficulty  Pain immediately resolved suggesting accurate placement of the medication.  Advised to call if fevers/chills, erythema, induration, drainage, or persistent bleeding.  Images permanently stored and available for review in the ultrasound unit.  Impression: Technically successful ultrasound guided injection.  Procedure: Real-time Ultrasound Guided Injection of left third flexor tendon sheath Device: GE Logiq E  Ultrasound guided injection is preferred based studies that show increased duration,  increased effect, greater accuracy, decreased procedural pain, increased response rate, and decreased cost with ultrasound guided versus blind injection.  Verbal informed consent obtained.  Time-out conducted.  Noted no overlying erythema, induration, or other signs of local infection.  Skin prepped in a sterile fashion.  Local anesthesia: Topical Ethyl chloride.  With sterile technique and under real time ultrasound guidance:  25-gauge 1 inch needle patient was injected with a total of 0.5 mL of 0.5% Marcaine and 0.5 mL of Kenalog 40 mg/dL Completed without difficulty  Pain immediately resolved suggesting accurate placement of the medication.  Advised to call if fevers/chills, erythema, induration, drainage, or persistent bleeding.  Images permanently stored and available for review in the ultrasound unit.  Impression: Technically successful ultrasound guided injection.   Impression and Recommendations:     This case required medical decision making of moderate complexity.      Note: This dictation was prepared with Dragon dictation along with smaller phrase technology. Any transcriptional errors that result from this process are unintentional.

## 2015-04-04 NOTE — Assessment & Plan Note (Signed)
Injected under ultrasound today. I'm fairly good resolution of pain. Mild underlying arthritic changes of the [facility] joint. Monitor. Discussed bracing, topical anti-inflammatory, manual massage. Can repeat injection every 3 months as needed. Follow-up in 3 weeks.

## 2015-04-04 NOTE — Patient Instructions (Signed)
Good to see you Cindy Clark is your friend if needed Will trigger a little more for 2 days then better See me again in 3 weeks if not perfect Happy New Year!

## 2015-04-04 NOTE — Progress Notes (Signed)
Pre visit review using our clinic review tool, if applicable. No additional management support is needed unless otherwise documented below in the visit note. 

## 2015-04-24 ENCOUNTER — Ambulatory Visit: Payer: BLUE CROSS/BLUE SHIELD | Admitting: Family Medicine

## 2015-04-28 ENCOUNTER — Encounter: Payer: Self-pay | Admitting: Family Medicine

## 2015-04-28 ENCOUNTER — Ambulatory Visit (INDEPENDENT_AMBULATORY_CARE_PROVIDER_SITE_OTHER): Payer: BLUE CROSS/BLUE SHIELD | Admitting: Family Medicine

## 2015-04-28 ENCOUNTER — Ambulatory Visit: Payer: BLUE CROSS/BLUE SHIELD | Admitting: Family Medicine

## 2015-04-28 VITALS — BP 124/84 | HR 79 | Ht 64.0 in | Wt 200.0 lb

## 2015-04-28 DIAGNOSIS — M65332 Trigger finger, left middle finger: Secondary | ICD-10-CM | POA: Diagnosis not present

## 2015-04-28 DIAGNOSIS — M65312 Trigger thumb, left thumb: Secondary | ICD-10-CM

## 2015-04-28 NOTE — Progress Notes (Signed)
[name] [place], [place] [address] Phone: [phone] Subjective:    I'm seeing this patient by the request  of:  Colin Benton R., DO   CC:  Left thumb pain  [id number] Cindy Clark is a 50 y.o. female coming in with complaint of left thumb pain. Patient was found to have her finger of the thumb as well as middle finger on the left hand. Patient was given injections in both areas. Patient was to do some home exercises and some minimal bracing. Patient was to do an icing regimen. Patient states had complete resolution of pain for proximal he one week. Then started having some mild increasing pain again over the left thumb. States that it still with the flexion. 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 Denies any numbness. Still better than what it was prior to the injection but not perfect. No pain with the middle finger.     Past Medical History  Diagnosis Date  . Heart murmur     when pregnant - told was normal  . Thyroid disease     reports seen by endocrinologist in the past for thyroid nodules, biopsy and told benign and told nothing further needed  . Elevated blood pressure   . Tobacco use    Past Surgical History  Procedure Laterality Date  . Facial cosmetic surgery    . Facial cosmetic surgery    . Ectopic pregnancy surgery     Social History   Social History  . Marital Status: Married    Spouse Name: N/A  . Number of Children: N/A  . Years of Education: N/A   Social History Main Topics  . Smoking status: Former Smoker -- 1.00 packs/day for 30 years  . Smokeless tobacco: None     Comment: used to be light smoker but ppd since 2012  . Alcohol Use: No  . Drug Use: No  . Sexual Activity: Not Asked   Other Topics Concern  . None   Social History Narrative   Work or School: LPN school      Home Situation: lives with husband Environmental manager)      Spiritual Beliefs: believes in  [place], no church      Lifestyle: no regular exercise; diet is good         No Known Allergies Family History  Problem Relation Age of Onset  . Heart disease Mother 75    MI  . Hypertension Mother   . Diabetes Brother   . Hypertension Brother     Past medical history, social, surgical and family history all reviewed in electronic medical record.  No pertanent information unless stated regarding to the chief complaint.   Review of Systems: No headache, visual changes, nausea, vomiting, diarrhea, constipation, dizziness, abdominal pain, skin rash, fevers, chills, night sweats, weight loss, swollen lymph nodes, body aches, joint swelling, muscle aches, chest pain, shortness of breath, mood changes.   Objective Blood pressure 124/84, pulse 79, height 5\' 4"  (1.626 m), weight 200 lb (90.719 kg), SpO2 96 %.  General: No apparent distress alert and oriented x3 mood and affect normal, dressed appropriately.  HEENT: Pupils equal, extraocular movements intact  Respiratory: Patient's speak in full sentences and does not appear short of breath  Cardiovascular: No lower extremity edema, non tender, no erythema  Skin: Warm dry intact with no signs of infection or rash on extremities or on axial skeleton.  Abdomen: Soft nontender  Neuro: Cranial nerves II through XII are intact, neurovascularly intact in all extremities with 2+ DTRs and 2+ pulses.  Lymph: No lymphadenopathy of posterior or anterior cervical chain or axillae bilaterally.  Gait normal with good balance and coordination.  MSK:  Non tender with full range of motion and good stability and symmetric strength and tone of shoulders, elbows, wrist, hip, knee and ankles bilaterally.  Hand exam shows patient's left thumb does have a trigger nodule just proximal to the IP joint. Tender to palpation. No triggering noted.negative grind test No triggering of the left middle finger     Impression and Recommendations:     This case required  medical decision making of moderate complexity.      Note: This dictation was prepared with Dragon dictation along with smaller phrase technology. Any transcriptional errors that result from this process are unintentional.

## 2015-04-28 NOTE — Assessment & Plan Note (Signed)
Completely resolved at this time. 

## 2015-04-28 NOTE — Assessment & Plan Note (Signed)
Still has nodule noted. We discussed topical anti-inflammatories, home exercises as well as immobilization. Patient is, try this first. Patient will come back again in 4-6 weeks. At that time if worsening symptoms we may need to consider repeat injection.

## 2015-04-28 NOTE — Progress Notes (Signed)
Pre visit review using our clinic review tool, if applicable. No additional management support is needed unless otherwise documented below in the visit note. 

## 2015-04-28 NOTE — Patient Instructions (Signed)
Good to see you  Ice is your friend pennsaid pinkie amount topically 2 times daily as needed.  Wear brace nightly and as much during the day for the next week then nightly for another 2 weeks.  Keep doing what you want but if not gone in 4-6 weeks then see me again and we will do injection.

## 2015-05-05 ENCOUNTER — Encounter: Payer: Self-pay | Admitting: Family Medicine

## 2015-05-05 NOTE — Telephone Encounter (Signed)
I left a message for the pt to return my call. 

## 2015-05-06 ENCOUNTER — Ambulatory Visit (INDEPENDENT_AMBULATORY_CARE_PROVIDER_SITE_OTHER): Payer: BLUE CROSS/BLUE SHIELD | Admitting: Family Medicine

## 2015-05-06 ENCOUNTER — Encounter: Payer: Self-pay | Admitting: *Deleted

## 2015-05-06 ENCOUNTER — Encounter: Payer: Self-pay | Admitting: Family Medicine

## 2015-05-06 VITALS — BP 140/90 | HR 93 | Temp 98.5°F | Ht 64.0 in | Wt 197.7 lb

## 2015-05-06 DIAGNOSIS — M79604 Pain in right leg: Secondary | ICD-10-CM | POA: Diagnosis not present

## 2015-05-06 NOTE — Telephone Encounter (Signed)
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  I advised the pt again of what Dr Maudie Mercury recommended and scheduled an appt today at 1:45pm.

## 2015-05-06 NOTE — Patient Instructions (Signed)
-  We placed a referral for you as discussed to get an [place] of your R lower leg and to the orthopedic doctor to evaluate for musculoskeletal or back/nerve related causes of leg pain. Please keep your appointment with the vascular doctor as well. It usually takes about 1-2 weeks to process and schedule this referral. If you have not heard from [place] regarding this appointment in 2 weeks please contact our office.

## 2015-05-06 NOTE — Progress Notes (Addendum)
HPI:  Cindy Clark is a 50 yo with a hx of obesity, trigger finger and tobacco use here for an acute visit for R leg pain. She told me about this at her last visit about a month ago and she was concerned for claudication. The pain has been ongoing for over 1 year per her report, and oddly, at her last visit she reported it did not occur with leg machine exercises or jogging in place, but did occur with walking. At her appt she described pain in the entire R leg and a heavy sensation after walking several blocks. I had her walk for some time in clinic and she reported some mild calf pain and a heavy feeling at that time after walking for several minutes. She is seeing a vascular doctor in a few weeks for evaluation. She had a completely normal NV and muscular exam of the LEs at her last visit.  She wants a letter excusing her from walking specific distances (>10 minutes) and pushing shopping carts at work and is upset that this letter was not supplied after an email request.  I advised further evaluation as she reported her leg and back could not tolerate walking in her email. She had not complained of back pain before. Today she angrily reports she does not have "pain in the back "but then says that she is "not going to lie "that she does get spasms in her back from time to time and reports if she tries to use her back to favor her leg at work at [employer] she may injure her back. Reports she will quit her job before she does that. She wants a note essentially allowing her to do everything except get the shopping carts as reports this is the only activity where she has to walk enough to cause her leg symptoms. She reports severe pain in the entire right leg that only occurs after walking "10-15 minutes", but not with "17,000 steps per day", shorter distances, breif aerobic activity (and demonstrates a slow jog in place with arms moving ), at her curves class, or other activities.  She was very upset when I  walked in the room and accused me of telling her that she does not have pain or her pain is not real and of not believing her symptoms. She reports she is against doing any medicines, as she reports she does not take pills. She also initially was against doing further evaluation as she reports she plans to see the vascular doctor for this and just needs a note limiting her activities at work until seen. No weakness, numbness, swelling, fevers, malaise, unexplained weight loss, or other symptoms. She mentions a possibility of "blood clot" several times during this very lengthy visit but initially refuses an ultrasound of the lower extremity.  ROS: See pertinent positives and negatives per HPI.  Past Medical History  Diagnosis Date  . Heart murmur     when pregnant - told was normal  . Thyroid disease     reports seen by endocrinologist in the past for thyroid nodules, biopsy and told benign and told nothing further needed  . Elevated blood pressure   . Tobacco use     Past Surgical History  Procedure Laterality Date  . Facial cosmetic surgery    . Facial cosmetic surgery    . Ectopic pregnancy surgery      Family History  Problem Relation Age of Onset  . Heart disease Mother 102  MI  . Hypertension Mother   . Diabetes Brother   . Hypertension Brother     Social History   Social History  . Marital Status: Married    Spouse Name: N/A  . Number of Children: N/A  . Years of Education: N/A   Social History Main Topics  . Smoking status: Former Smoker -- 1.00 packs/day for 30 years  . Smokeless tobacco: None     Comment: used to be light smoker but ppd since 2012  . Alcohol Use: No  . Drug Use: No  . Sexual Activity: Not Asked   Other Topics Concern  . None   Social History Narrative   Work or School: LPN school      Home Situation: lives with husband Environmental manager)      Spiritual Beliefs: believes in [place], no church      Lifestyle: no regular exercise; diet is  good          No current outpatient prescriptions on file.  EXAM:  Filed Vitals:   05/06/15 1332  BP: 140/90  Pulse: 93  Temp: 98.5 F (36.9 C)    Body mass index is 33.92 kg/(m^2).  GENERAL: vitals reviewed and listed above, alert, oriented, appears well hydrated and in no acute distress  HEENT: atraumatic, conjunttiva clear, no obvious abnormalities on inspection of external nose and ears  NECK: no obvious masses on inspection  MS/NEURO: moves all extremities without noticeable abnormality, normal gait, normal inspection of the low back and LEs bilaterally. No bony or soft tissue pain on palpation of the major structures of the low back, glutes, hips or legs.No swelling, redness or edema of legs. Normal pedal pulses. No TTP of deep veins or other tissues of the legs. Normal muscle strength, sensation to light touch, DTRs. Neg SLRT and CLRT.   PSYCH: angry and confrontational  ASSESSMENT AND PLAN:  Discussed the following assessment and plan:  Pain of right lower extremity - Plan: Ambulatory referral to Orthopedic Surgery, VAS [place] LOWER EXTREMITY VENOUS (DVT)  > 30 minutes spent with this patient -reassured her that nobody here had told her that we do not believe that her pain is real -her symptoms and exam are somewhat perplexing and we discussed potential etiologies of leg pain but I admitted I do not have a clear answer for the cause of her symptoms today and advised further evaluation  -she agreed to see ortho, vascular as planned and obtain a prompt LE [place] under the condition of a work note limiting reported aggravating activities until seen by specialists. -tried to offer symptomatic treatment which she refused -will have her follow up for regular follow up after specialist evaluation -Patient advised to return or notify a doctor immediately if symptoms worsen or persist or new concerns arise.  Patient Instructions  -We placed a referral for you as discussed to get an  [place] of your R lower leg and to the orthopedic doctor to evaluate for musculoskeletal or back/nerve related causes of leg pain. Please keep your appointment with the vascular doctor as well. It usually takes about 1-2 weeks to process and schedule this referral. If you have not heard from [place] regarding this appointment in 2 weeks please contact our office.      Colin Benton R.

## 2015-05-06 NOTE — Progress Notes (Signed)
Pre visit review using our clinic review tool, if applicable. No additional management support is needed unless otherwise documented below in the visit note. 

## 2015-05-10 ENCOUNTER — Emergency Department (HOSPITAL_COMMUNITY)
Admission: EM | Admit: 2015-05-10 | Discharge: 2015-05-11 | Disposition: A | Discharge status: Home / Self Care | Payer: BLUE CROSS/BLUE SHIELD | Attending: Emergency Medicine | Admitting: Emergency Medicine

## 2015-05-10 ENCOUNTER — Emergency Department (HOSPITAL_COMMUNITY): Payer: BLUE CROSS/BLUE SHIELD

## 2015-05-10 ENCOUNTER — Encounter (HOSPITAL_COMMUNITY): Payer: Self-pay | Admitting: *Deleted

## 2015-05-10 DIAGNOSIS — R0789 Other chest pain: Secondary | ICD-10-CM | POA: Diagnosis not present

## 2015-05-10 DIAGNOSIS — Z87891 Personal history of nicotine dependence: Secondary | ICD-10-CM | POA: Diagnosis not present

## 2015-05-10 DIAGNOSIS — Z8639 Personal history of other endocrine, nutritional and metabolic disease: Secondary | ICD-10-CM | POA: Diagnosis not present

## 2015-05-10 DIAGNOSIS — R079 Chest pain, unspecified: Secondary | ICD-10-CM | POA: Diagnosis present

## 2015-05-10 DIAGNOSIS — R011 Cardiac murmur, unspecified: Secondary | ICD-10-CM | POA: Insufficient documentation

## 2015-05-10 LAB — CBC
HEMATOCRIT: 41 % (ref 36.0–46.0)
HEMOGLOBIN: 13.7 g/dL (ref 12.0–15.0)
MCH: 31.9 pg (ref 26.0–34.0)
MCHC: 33.4 g/dL (ref 30.0–36.0)
MCV: 95.6 fL (ref 78.0–100.0)
Platelets: 197 10*3/uL (ref 150–400)
RBC: 4.29 MIL/uL (ref 3.87–5.11)
RDW: 13.1 % (ref 11.5–15.5)
WBC: 9.8 10*3/uL (ref 4.0–10.5)

## 2015-05-10 LAB — BASIC METABOLIC PANEL
ANION GAP: 11 (ref 5–15)
BUN: 13 mg/dL (ref 6–20)
CALCIUM: 9.1 mg/dL (ref 8.9–10.3)
CO2: 23 mmol/L (ref 22–32)
Chloride: 104 mmol/L (ref 101–111)
Creatinine, Ser: 0.88 mg/dL (ref 0.44–1.00)
GFR calc Af Amer: >60 mL/min (ref >60)
Glucose, Bld: 128 mg/dL — ABNORMAL HIGH (ref 65–99)
POTASSIUM: 3.6 mmol/L (ref 3.5–5.1)
SODIUM: 138 mmol/L (ref 135–145)

## 2015-05-10 LAB — I-STAT TROPONIN, ED: TROPONIN I, POC: 0 ng/mL (ref 0.00–0.08)

## 2015-05-10 NOTE — ED Notes (Signed)
Pt complains of left sided chest pain since 1pm this morning. Pt states she is supposed to get a scan to determine if she has blood clots in her right leg due to claudication. Pt denies shortness of breath, diaphoresis, nausea, dizziness.

## 2015-05-11 ENCOUNTER — Encounter (HOSPITAL_COMMUNITY): Payer: Self-pay | Admitting: Emergency Medicine

## 2015-05-11 MED ORDER — METHOCARBAMOL 500 MG PO TABS
1000.0000 mg | ORAL_TABLET | Freq: Once | ORAL | Status: AC
  Administered 2015-05-11: 1000 mg via ORAL
  Filled 2015-05-11: qty 2

## 2015-05-11 MED ORDER — METHOCARBAMOL 500 MG PO TABS: 500.0000 mg | ORAL_TABLET | Freq: Two times a day (BID) | ORAL | Status: DC

## 2015-05-11 MED ORDER — NAPROXEN 500 MG PO TABS
500.0000 mg | ORAL_TABLET | Freq: Once | ORAL | Status: AC
  Administered 2015-05-11: 500 mg via ORAL
  Filled 2015-05-11: qty 1

## 2015-05-11 MED ORDER — NAPROXEN 375 MG PO TABS: 375.0000 mg | ORAL_TABLET | Freq: Two times a day (BID) | ORAL | Status: DC

## 2015-05-11 NOTE — ED Provider Notes (Signed)
CSN: [id number]     Arrival date & time 05/10/15  2139 History  By signing my name below, I, Irene Pap, attest that this documentation has been prepared under the direction and in the presence of Shooter Tangen, MD. Electronically Signed: Irene Pap, ED Scribe. 05/11/2015. 1:01 AM.    Chief Complaint  Patient presents with  . Chest Pain   Patient is a 50 y.o. female presenting with chest pain. The history is provided by the patient. No language interpreter was used.  Chest Pain Pain location:  L chest Pain quality: not tearing   Pain radiates to:  Does not radiate Pain radiates to the back: no   Pain severity:  Moderate Onset quality:  Sudden Duration:  12 hours Timing:  Constant Progression:  Worsening Chronicity:  New Context: lifting, movement and raising an arm   Relieved by:  Nothing Worsened by:  Certain positions and movement Ineffective treatments:  None tried Associated symptoms: no diaphoresis, no dizziness, no nausea, no palpitations and no shortness of breath   Risk factors: not female   HPI Comments: Cindy Clark is a 50 y.o. Female with a hx of heart murmur, thyroid disease, tobacco use, and who presents to the Emergency Department complaining of left sided chest pain onset 12 hours ago. She reports worsening pain with movement of the left arm. She reports that she is due to have an AVI test done on March 3rd for possible PE of the left leg. She has not taken anything for the pain PTA. 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 She denies SOB, diaphoresis, nausea, or dizziness.   Past Medical History  Diagnosis Date  . Heart murmur     when pregnant - told was normal  . Thyroid disease     reports seen by endocrinologist in the past for thyroid nodules, biopsy and told benign and told nothing further needed  . Elevated blood pressure   . Tobacco use    Past Surgical History  Procedure Laterality Date  . Facial cosmetic surgery     . Facial cosmetic surgery    . Ectopic pregnancy surgery     Family History  Problem Relation Age of Onset  . Heart disease Mother 58    MI  . Hypertension Mother   . Diabetes Brother   . Hypertension Brother    Social History  Substance Use Topics  . Smoking status: Former Smoker -- 1.00 packs/day for 30 years  . Smokeless tobacco: None     Comment: used to be light smoker but ppd since 2012  . Alcohol Use: No   OB History    No data available     Review of Systems  Constitutional: Negative for diaphoresis.  Respiratory: Negative for shortness of breath.   Cardiovascular: Positive for chest pain. Negative for palpitations and leg swelling.  Gastrointestinal: Negative for nausea.  Neurological: Negative for dizziness.  All other systems reviewed and are negative.  Allergies  Review of patient's allergies indicates no known allergies.  Home Medications   Prior to Admission medications   Not on File   BP 159/86 mmHg  Pulse 70  Temp(Src) 98.3 F (36.8 C) (Oral)  Resp 18  SpO2 99% Physical Exam  Constitutional: She is oriented to person, place, and time. She appears well-developed and well-nourished. No distress.  HENT:  Head: Normocephalic and atraumatic.  Mouth/Throat: Oropharynx is clear and moist. No oropharyngeal exudate.  Trachea midline  Eyes: Conjunctivae  and EOM are normal. Pupils are equal, round, and reactive to light.  Neck: Trachea normal and normal range of motion. Neck supple. No JVD present. Carotid bruit is not present.  Cardiovascular: Normal rate and regular rhythm.  Exam reveals no gallop and no friction rub.   No murmur heard. Pulmonary/Chest: Effort normal and breath sounds normal. No stridor. She has no wheezes. She has no rales. She exhibits tenderness.  Abdominal: Soft. Bowel sounds are normal. She exhibits no mass. There is no tenderness. There is no rebound and no guarding.  Musculoskeletal: Normal range of motion. She exhibits no edema  or tenderness.  Left pectoralis strain  Lymphadenopathy:    She has no cervical adenopathy.  Neurological: She is alert and oriented to person, place, and time. She has normal reflexes. No cranial nerve deficit. She exhibits normal muscle tone. Coordination normal.  Cranial nerves 2-12 intact  Skin: Skin is warm and dry. She is not diaphoretic.  Psychiatric: She has a normal mood and affect. Her behavior is normal.  Nursing note and vitals reviewed.   ED Course  Procedures (including critical care time) DIAGNOSTIC STUDIES: Oxygen Saturation is 99% on RA, normal by my interpretation.    COORDINATION OF CARE: 1:00 AM-Discussed treatment plan which includes NSAIDs with pt at bedside and pt agreed to plan.    Labs Review Labs Reviewed  BASIC METABOLIC PANEL - Abnormal; Notable for the following:    Glucose, Bld 128 (*)    All other components within normal limits  CBC  I-STAT TROPOININ, ED    Imaging Review Dg Chest 2 View  05/10/2015  CLINICAL DATA:  Left-sided chest pain for 8 hours. Pain radiates to the left arm. EXAM: CHEST  2 VIEW COMPARISON:  None. FINDINGS: The cardiomediastinal contours are normal. The lungs are clear. Pulmonary vasculature is normal. No consolidation, pleural effusion, or pneumothorax. No acute osseous abnormalities are seen. IMPRESSION: No acute pulmonary process. Electronically Signed   By: Jeb Levering M.D.   On: 05/10/2015 22:44   I have personally reviewed and evaluated these images and lab results as part of my medical decision-making.   EKG Interpretation   Date/Time:  Saturday May 10 2015 21:53:26 EST Ventricular Rate:  72 PR Interval:  141 QRS Duration: 74 QT Interval:  402 QTC Calculation: 440 R Axis:   60 Text Interpretation:  Sinus rhythm Premature atrial complexes Confirmed by  [facility]  MD, Lanny Lipkin ([id number]) on 05/11/2015 12:56:27 AM      MDM   Final diagnoses:  None    Started a new job with a lot of lifting pain is  with movement of the arm and reproducible on palpation.  Highly atypical for cardiac pain and in the setting of a normal EKG and negative troponin with > 8 hours of symptoms ACS is excluded.  Patient is PERC negative, well0.  Highly doubt PE.  Will treat for MSK pain and have patient follow up with their PMD.  Strict return precautions given for CP, DOE SOB, n/v/d.  Patient verbalizes understanding and agrees to follow up   I personally performed the services described in this documentation, which was scribed in my presence. The recorded information has been reviewed and is accurate.     Veatrice Kells, MD 05/11/15 [phone]

## 2015-05-11 NOTE — Discharge Instructions (Signed)

## 2015-05-12 ENCOUNTER — Other Ambulatory Visit: Payer: Self-pay

## 2015-05-12 DIAGNOSIS — M79604 Pain in right leg: Secondary | ICD-10-CM

## 2015-05-23 ENCOUNTER — Other Ambulatory Visit (HOSPITAL_COMMUNITY): Payer: Self-pay | Admitting: Sports Medicine

## 2015-05-23 ENCOUNTER — Ambulatory Visit (HOSPITAL_COMMUNITY)
Admission: RE | Admit: 2015-05-23 | Discharge: 2015-05-23 | Disposition: A | Discharge status: Home / Self Care | Payer: BLUE CROSS/BLUE SHIELD | Source: Ambulatory Visit | Attending: Vascular Surgery | Admitting: Vascular Surgery

## 2015-05-23 DIAGNOSIS — M79604 Pain in right leg: Secondary | ICD-10-CM | POA: Diagnosis not present

## 2015-05-23 DIAGNOSIS — Z720 Tobacco use: Secondary | ICD-10-CM | POA: Insufficient documentation

## 2015-05-29 ENCOUNTER — Ambulatory Visit (INDEPENDENT_AMBULATORY_CARE_PROVIDER_SITE_OTHER): Payer: BLUE CROSS/BLUE SHIELD | Admitting: Family Medicine

## 2015-05-29 ENCOUNTER — Telehealth: Payer: Self-pay | Admitting: Family Medicine

## 2015-05-29 ENCOUNTER — Encounter: Payer: Self-pay | Admitting: Family Medicine

## 2015-05-29 VITALS — BP 160/90 | HR 87 | Temp 98.6°F | Ht 64.0 in | Wt 199.5 lb

## 2015-05-29 DIAGNOSIS — M79604 Pain in right leg: Secondary | ICD-10-CM

## 2015-05-29 DIAGNOSIS — E669 Obesity, unspecified: Secondary | ICD-10-CM

## 2015-05-29 DIAGNOSIS — R739 Hyperglycemia, unspecified: Secondary | ICD-10-CM

## 2015-05-29 DIAGNOSIS — E785 Hyperlipidemia, unspecified: Secondary | ICD-10-CM

## 2015-05-29 DIAGNOSIS — I10 Essential (primary) hypertension: Secondary | ICD-10-CM | POA: Diagnosis not present

## 2015-05-29 LAB — LIPID PANEL
Cholesterol: 222 mg/dL — ABNORMAL HIGH (ref 0–200)
HDL: 56.3 mg/dL (ref >39.00)
LDL Cholesterol: 139 mg/dL — ABNORMAL HIGH (ref 0–99)
NONHDL: 166.06
TRIGLYCERIDES: 135 mg/dL (ref 0.0–149.0)
Total CHOL/HDL Ratio: 4
VLDL: 27 mg/dL (ref 0.0–40.0)

## 2015-05-29 LAB — BASIC METABOLIC PANEL
BUN: 11 mg/dL (ref 6–23)
CHLORIDE: 106 meq/L (ref 96–112)
CO2: 28 mEq/L (ref 19–32)
Calcium: 9.4 mg/dL (ref 8.4–10.5)
Creatinine, Ser: 0.78 mg/dL (ref 0.40–1.20)
GFR: 99.73 mL/min (ref >60.00)
Glucose, Bld: 128 mg/dL — ABNORMAL HIGH (ref 70–99)
POTASSIUM: 3.8 meq/L (ref 3.5–5.1)
SODIUM: 141 meq/L (ref 135–145)

## 2015-05-29 LAB — HEMOGLOBIN A1C: Hgb A1c MFr Bld: 6.3 % (ref 4.6–6.5)

## 2015-05-29 MED ORDER — AMLODIPINE BESYLATE 5 MG PO TABS: 5.0000 mg | ORAL_TABLET | Freq: Every day | ORAL | Status: DC

## 2015-05-29 NOTE — Progress Notes (Addendum)
HPI:  Cindy Clark is a 50 yo here for follow up. She has a history of poor compliance with care/follow up recommendations.   Obesity/Prediabetes/HLD: -did not follow up last year as advised -she is fasting today for labs  HTN: -elevated now at two visits -no CP, SOB, swelling -did have ED eval for CP recently and reports was told had strained muscle as was reproducible on exam with neg evaluation - now resolved -FH HTN in many family members  R leg pain: -reports now is doing much better, can walk "30 minutes" without pain, reports "miraculous" resolution -she reports had eval with orthopedics and vascular and she was told all was good -reports has follow up with her specialists  Hx of back spasm and muscle strain in chest: -she reports does not have a current issue "I do not have a medical condition of my back or chest" -however, reports fear that if she pushes even a few shopping carts she will get muscle strain or sprain in chest or back -reports a prior doctor told her "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 She accused [place] of telling her we did not believe that her pain was real, which we had not done. We had actually advised evaluation with several specialist for her concern.   Colon ca screening: declined colonoscopy at last CPE, agreed to do stool cards but did not return them.   ROS: See pertinent positives and negatives  per HPI.  Past Medical History  Diagnosis Date  . Heart murmur     when pregnant - told was normal  . Thyroid disease     reports seen by endocrinologist in the past for thyroid nodules, biopsy and told benign and told nothing further needed  . Elevated blood pressure   . Tobacco use     Past Surgical History  Procedure Laterality Date  . Facial cosmetic surgery    . Facial cosmetic surgery    . Ectopic pregnancy surgery      Family History  Problem Relation Age of Onset  . Heart disease Mother 66    MI  . Hypertension Mother   . Diabetes Brother   . Hypertension Brother     Social History   Social History  . Marital Status: Married    Spouse Name: N/A  . Number of Children: N/A  . Years of Education: N/A   Social History Main Topics  . Smoking status: Former Smoker -- 1.00 packs/day for 30 years  . Smokeless tobacco: None     Comment: used to be light smoker but ppd since 2012  . Alcohol Use: No  . Drug Use: No  . Sexual Activity: Not Asked   Other Topics Concern  . None   Social History Narrative   Work or School: LPN school      Home Situation: lives with husband Cindy Clark Architect)      Spiritual Beliefs: believes in  God, no church      Lifestyle: no regular exercise; diet is good           Current outpatient prescriptions:  Marland Kitchen  Multiple Vitamin (MULTIVITAMIN WITH MINERALS) TABS tablet, Take 1 tablet by mouth daily., Disp: , Rfl:  .  amLODipine (NORVASC) 5 MG tablet, Take 1 tablet (5 mg total) by mouth daily., Disp: 30 tablet, Rfl: 1  EXAM:  Filed Vitals:   05/29/15 0914  BP: 160/90  Pulse: 87  Temp: 98.6 F (37 C)  BP improved on recheck to 142/90  Body mass index is 34.23 kg/(m^2).  GENERAL: vitals reviewed and listed above, alert, oriented, appears well hydrated and in no acute distress  HEENT: atraumatic, conjunttiva clear, no obvious abnormalities on inspection of external nose and ears  NECK: no obvious masses on  inspection  LUNGS: clear to auscultation bilaterally, no wheezes, rales or rhonchi, good air movement  CV: HRRR, no peripheral edema  MS: moves all extremities without noticeable abnormality, normal gait  PSYCH: pleasant and cooperative, no obvious depression or anxiety  ASSESSMENT AND PLAN:  Discussed the following assessment and plan:  Essential hypertension - Plan: Basic metabolic panel, DISCONTINUED: amLODipine (NORVASC) 5 MG tablet  Hyperglycemia - Plan: Hemoglobin A1c  Hyperlipemia - Plan: Lipid panel  Obesity  Pain of right lower extremity  -advised labs -advised of options and risks for treatment of htn - she opted to start norvasc -advised further evaluation for her back and muscle concerns, she seemed upset and reported she just needs letter for work restrictions to not push shopping carts; she threatens to quit her job or start smoking again and acuses me of not caring about her if I don't give her the letter. -I reassured her again that I do care about her, and strive to provide her with excellent care and would like to help her. She states again that I do not care about her and do no want to help her. I suggested she should see another provider if she feels this way. She agrees. Letter of dismissal provided by my office manager -She became upset before any exam of her back or chest was performed and opted to see another provider. No current complaints and this exam not done due to situation. I advised sports med or orthopedic eval if any concerns or symptoms. -advised her I will be happy to assist her for urgent medical needs for 30 days (outlined in letter given to pt) -advised she establish care with another provider right away as needs ongoing treatment and evaluation for her health issues - she agrees to do so -she refuses all HM measures reviewed and advised today -Patient advised to return or notify a doctor immediately if symptoms worsen or persist or new concerns  arise.  Patient Instructions  BEFORE YOU LEAVE: -labs -Wendie Simmer, please obtain records from orthopedic and vascular specialists -see letter of dismissal provided by my office manager  Start the blood pressure medication. Follow up with me or your new medical doctor in 2-4 weeks to recheck you blood pressure.  See your orthopedic, sports medicine or medical doctor if you have an back issues or musculoskeletal issues.  Follow up with your specialists as scheduled.  We recommend the following healthy lifestyle measures: - eat a healthy whole foods diet consisting of regular small meals composed of vegetables, fruits, beans, nuts, seeds, healthy meats such as white chicken and fish and whole grains.  - avoid sweets, white starchy foods, fried  foods, fast food, processed foods, sodas, red meet and other fattening foods.  - get a least 150-300 minutes of aerobic exercise per week.       Colin Benton R.

## 2015-05-29 NOTE — Telephone Encounter (Signed)
Patient dismissed from [facility] by Colin Benton MD , effective May 29, 2015. Dismissal letter given to patient during 05/29/15 office visit. DAJ

## 2015-05-29 NOTE — Progress Notes (Signed)
Pre visit review using our clinic review tool, if applicable. No additional management support is needed unless otherwise documented below in the visit note. 

## 2015-05-29 NOTE — Patient Instructions (Addendum)
BEFORE YOU LEAVE: -labs -Wendie Simmer, please obtain records from orthopedic and vascular specialists -see letter of dismissal provided by my office manager  Start the blood pressure medication. Follow up with me or your new medical doctor in 2-4 weeks to recheck you blood pressure.  See your orthopedic, sports medicine or medical doctor if you have an back issues or musculoskeletal issues.  Follow up with your specialists as scheduled.  We recommend the following healthy lifestyle measures: - eat a healthy whole foods diet consisting of regular small meals composed of vegetables, fruits, beans, nuts, seeds, healthy meats such as white chicken and fish and whole grains.  - avoid sweets, white starchy foods, fried foods, fast food, processed foods, sodas, red meet and other fattening foods.  - get a least 150-300 minutes of aerobic exercise per week.

## 2015-06-09 ENCOUNTER — Encounter: Payer: Self-pay | Admitting: Family Medicine

## 2015-06-09 ENCOUNTER — Other Ambulatory Visit (INDEPENDENT_AMBULATORY_CARE_PROVIDER_SITE_OTHER): Payer: BLUE CROSS/BLUE SHIELD

## 2015-06-09 ENCOUNTER — Ambulatory Visit (INDEPENDENT_AMBULATORY_CARE_PROVIDER_SITE_OTHER): Payer: BLUE CROSS/BLUE SHIELD | Admitting: Family Medicine

## 2015-06-09 VITALS — BP 130/84 | HR 74 | Ht 64.0 in | Wt 203.0 lb

## 2015-06-09 DIAGNOSIS — M65312 Trigger thumb, left thumb: Secondary | ICD-10-CM | POA: Diagnosis not present

## 2015-06-09 NOTE — Patient Instructions (Signed)
Good to see you  Cindy Clark is your friend Stay active but be careful next 2 days We can repeat the injection every 3 months if needed but I do not think it will be See me again when you need me.

## 2015-06-09 NOTE — Progress Notes (Signed)
[facility] [address] [place], [place] [address] Phone: [phone] Subjective:    I'm seeing this patient by the request  of:  Colin Benton R., DO   CC:  Left thumb painFollow-up   [id number] Cindy Clark is a 50 y.o. female coming in with complaint of left thumb pain. Patient was found to have her finger of the thumb as well as middle finger on the left hand. Patient was given injections in both areas. Has been multiple months. States that the middle finger complaint resolved. Continues to have some pain in the thumb. States that the surgery no has been significant better. States that the pain is still giving her some difficulty wished her work. Patient is a hair stylist. Denies any numbness.     Past Medical History  Diagnosis Date  . Heart murmur     when pregnant - told was normal  . Thyroid disease     reports seen by endocrinologist in the past for thyroid nodules, biopsy and told benign and told nothing further needed  . Elevated blood pressure   . Tobacco use    Past Surgical History  Procedure Laterality Date  . Facial cosmetic surgery    . Facial cosmetic surgery    . Ectopic pregnancy surgery     Social History   Social History  . Marital Status: Married    Spouse Name: N/A  . Number of Children: N/A  . Years of Education: N/A   Social History Main Topics  . Smoking status: Former Smoker -- 1.00 packs/day for 30 years  . Smokeless tobacco: Not on file     Comment: used to be light smoker but ppd since 2012  . Alcohol Use: No  . Drug Use: No  . Sexual Activity: Not on file   Other Topics Concern  . Not on file   Social History Narrative   Work or School: LPN school      Home Situation: lives with husband [name])      Spiritual Beliefs: believes in God, no church      Lifestyle: no regular exercise; diet is good         Allergies  Allergen Reactions  . Wellbutrin [Bupropion]    Family History    Problem Relation Age of Onset  . Heart disease Mother 74    MI  . Hypertension Mother   . Diabetes Brother   . Hypertension Brother     Past medical history, social, surgical and family history all reviewed in electronic medical record.  No pertanent information unless stated regarding to the chief complaint.   Review of Systems: No headache, visual changes, nausea, vomiting, diarrhea, constipation, dizziness, abdominal pain, skin rash, fevers, chills, night sweats, weight loss, swollen lymph nodes, body aches, joint swelling, muscle aches, chest pain, shortness of breath, mood changes.   Objective There were no vitals taken for this visit.  General: No apparent distress alert and oriented x3 mood and affect normal, dressed appropriately.  HEENT: Pupils equal, extraocular movements intact  Respiratory: Patient's speak in full sentences and does not appear short of breath  Cardiovascular: No lower extremity edema, non tender, no erythema  Skin: Warm dry intact with no signs of infection or rash on extremities or on axial skeleton.  Abdomen: Soft nontender  Neuro: Cranial nerves II through XII are intact, neurovascularly intact in all extremities with 2+ DTRs and 2+ pulses.  Lymph: No lymphadenopathy of posterior or anterior  cervical chain or axillae bilaterally.  Gait normal with good balance and coordination.  MSK:  Non tender with full range of motion and good stability and symmetric strength and tone of shoulders, elbows, wrist, hip, knee and ankles bilaterally.  Hand exam shows patient's left thumb does have a trigger nodule just proximal to the IP joint. Tender to palpation. No triggering noted.negative grind test No triggering of the left middle finger   Procedure: Real-time Ultrasound Guided Injection of first trigger nodule within flexor tendon sheath left side Device: GE Logiq E  Ultrasound guided injection is preferred based studies that show increased duration, increased  effect, greater accuracy, decreased procedural pain, increased response rate, and decreased cost with ultrasound guided versus blind injection.  Verbal informed consent obtained.  Time-out conducted.  Noted no overlying erythema, induration, or other signs of local infection.  Skin prepped in a sterile fashion.  Local anesthesia: Topical Ethyl chloride.  With sterile technique and under real time ultrasound guidance: With a 25-gauge half-inch no patient was injected with a total of 0.5 mL of 0.5% Marcaine and 0.5 mL of Kenalog 40 mg/dL within the tendon sheath Completed without difficulty  Pain immediately resolved suggesting accurate placement of the medication.  Advised to call if fevers/chills, erythema, induration, drainage, or persistent bleeding.  Images permanently stored and available for review in the ultrasound unit.  Impression: Technically successful ultrasound guided injection.      Impression and Recommendations:     This case required medical decision making of moderate complexity.      Note: This dictation was prepared with Dragon dictation along with smaller phrase technology. Any transcriptional errors that result from this process are unintentional.

## 2015-06-09 NOTE — Progress Notes (Signed)
Pre visit review using our clinic review tool, if applicable. No additional management support is needed unless otherwise documented below in the visit note. 

## 2015-06-09 NOTE — Assessment & Plan Note (Signed)
Patient given injection today and tolerated the procedure well. We discussed icing regimen and home exercises. We discussed possible bracing. 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  Spent  25 minutes with patient face-to-face and had greater than 50% of counseling including as described above in assessment and plan.

## 2015-06-12 ENCOUNTER — Encounter (HOSPITAL_COMMUNITY): Payer: BLUE CROSS/BLUE SHIELD

## 2015-06-12 ENCOUNTER — Encounter: Payer: BLUE CROSS/BLUE SHIELD | Admitting: Vascular Surgery

## 2015-06-13 ENCOUNTER — Encounter: Payer: BLUE CROSS/BLUE SHIELD | Admitting: Surgery

## 2015-06-13 ENCOUNTER — Encounter (HOSPITAL_COMMUNITY): Payer: BLUE CROSS/BLUE SHIELD

## 2015-06-19 ENCOUNTER — Encounter: Payer: BLUE CROSS/BLUE SHIELD | Admitting: Vascular Surgery

## 2015-07-01 DIAGNOSIS — E7800 Pure hypercholesterolemia, unspecified: Secondary | ICD-10-CM | POA: Diagnosis not present

## 2015-07-01 DIAGNOSIS — I119 Hypertensive heart disease without heart failure: Secondary | ICD-10-CM | POA: Diagnosis not present

## 2015-08-02 DIAGNOSIS — I119 Hypertensive heart disease without heart failure: Secondary | ICD-10-CM | POA: Diagnosis not present

## 2015-10-01 DIAGNOSIS — I119 Hypertensive heart disease without heart failure: Secondary | ICD-10-CM | POA: Diagnosis not present

## 2016-02-26 DIAGNOSIS — M5412 Radiculopathy, cervical region: Secondary | ICD-10-CM | POA: Diagnosis not present

## 2016-02-26 DIAGNOSIS — R0789 Other chest pain: Secondary | ICD-10-CM | POA: Diagnosis not present

## 2016-02-26 DIAGNOSIS — M25512 Pain in left shoulder: Secondary | ICD-10-CM | POA: Diagnosis not present

## 2016-02-26 DIAGNOSIS — M5030 Other cervical disc degeneration, unspecified cervical region: Secondary | ICD-10-CM | POA: Diagnosis not present

## 2016-02-28 DIAGNOSIS — I119 Hypertensive heart disease without heart failure: Secondary | ICD-10-CM | POA: Diagnosis not present

## 2016-03-17 DIAGNOSIS — Z13220 Encounter for screening for lipoid disorders: Secondary | ICD-10-CM | POA: Diagnosis not present

## 2016-03-17 DIAGNOSIS — I119 Hypertensive heart disease without heart failure: Secondary | ICD-10-CM | POA: Diagnosis not present

## 2016-03-17 DIAGNOSIS — Z0000 Encounter for general adult medical examination without abnormal findings: Secondary | ICD-10-CM | POA: Diagnosis not present

## 2016-03-18 DIAGNOSIS — Z732 Lack of relaxation and leisure: Secondary | ICD-10-CM | POA: Diagnosis not present

## 2016-03-18 DIAGNOSIS — Z0000 Encounter for general adult medical examination without abnormal findings: Secondary | ICD-10-CM | POA: Diagnosis not present

## 2016-03-18 DIAGNOSIS — Z119 Encounter for screening for infectious and parasitic diseases, unspecified: Secondary | ICD-10-CM | POA: Diagnosis not present

## 2016-03-18 DIAGNOSIS — E039 Hypothyroidism, unspecified: Secondary | ICD-10-CM | POA: Diagnosis not present

## 2016-03-18 DIAGNOSIS — Z13220 Encounter for screening for lipoid disorders: Secondary | ICD-10-CM | POA: Diagnosis not present

## 2016-03-24 DIAGNOSIS — Z1231 Encounter for screening mammogram for malignant neoplasm of breast: Secondary | ICD-10-CM | POA: Diagnosis not present

## 2016-04-01 DIAGNOSIS — R42 Dizziness and giddiness: Secondary | ICD-10-CM | POA: Diagnosis not present

## 2016-06-05 DIAGNOSIS — M542 Cervicalgia: Secondary | ICD-10-CM | POA: Diagnosis not present

## 2016-06-14 DIAGNOSIS — R109 Unspecified abdominal pain: Secondary | ICD-10-CM | POA: Diagnosis not present

## 2016-06-14 DIAGNOSIS — R160 Hepatomegaly, not elsewhere classified: Secondary | ICD-10-CM | POA: Diagnosis not present

## 2016-06-14 DIAGNOSIS — I10 Essential (primary) hypertension: Secondary | ICD-10-CM | POA: Diagnosis not present

## 2016-06-14 DIAGNOSIS — K5730 Diverticulosis of large intestine without perforation or abscess without bleeding: Secondary | ICD-10-CM | POA: Diagnosis not present

## 2016-06-14 DIAGNOSIS — R1011 Right upper quadrant pain: Secondary | ICD-10-CM | POA: Diagnosis not present

## 2016-06-14 DIAGNOSIS — K8020 Calculus of gallbladder without cholecystitis without obstruction: Secondary | ICD-10-CM | POA: Diagnosis not present

## 2016-06-14 DIAGNOSIS — K760 Fatty (change of) liver, not elsewhere classified: Secondary | ICD-10-CM | POA: Diagnosis not present

## 2016-06-14 DIAGNOSIS — R110 Nausea: Secondary | ICD-10-CM | POA: Diagnosis not present

## 2016-06-16 DIAGNOSIS — Z9851 Tubal ligation status: Secondary | ICD-10-CM | POA: Diagnosis not present

## 2016-06-16 DIAGNOSIS — K8020 Calculus of gallbladder without cholecystitis without obstruction: Secondary | ICD-10-CM | POA: Diagnosis not present

## 2016-06-16 DIAGNOSIS — Z6834 Body mass index (BMI) 34.0-34.9, adult: Secondary | ICD-10-CM | POA: Diagnosis not present

## 2016-06-16 DIAGNOSIS — Z87891 Personal history of nicotine dependence: Secondary | ICD-10-CM | POA: Diagnosis not present

## 2016-06-16 DIAGNOSIS — I10 Essential (primary) hypertension: Secondary | ICD-10-CM | POA: Diagnosis not present

## 2016-06-16 DIAGNOSIS — Z79899 Other long term (current) drug therapy: Secondary | ICD-10-CM | POA: Diagnosis not present

## 2016-06-16 DIAGNOSIS — E669 Obesity, unspecified: Secondary | ICD-10-CM | POA: Diagnosis not present

## 2016-06-16 DIAGNOSIS — K810 Acute cholecystitis: Secondary | ICD-10-CM | POA: Diagnosis not present

## 2016-06-16 DIAGNOSIS — K8000 Calculus of gallbladder with acute cholecystitis without obstruction: Secondary | ICD-10-CM | POA: Diagnosis not present

## 2016-06-18 DIAGNOSIS — K810 Acute cholecystitis: Secondary | ICD-10-CM | POA: Diagnosis not present

## 2016-06-18 DIAGNOSIS — Z87891 Personal history of nicotine dependence: Secondary | ICD-10-CM | POA: Diagnosis not present

## 2016-06-18 DIAGNOSIS — E669 Obesity, unspecified: Secondary | ICD-10-CM | POA: Diagnosis not present

## 2016-06-18 DIAGNOSIS — Z9851 Tubal ligation status: Secondary | ICD-10-CM | POA: Diagnosis not present

## 2016-06-18 DIAGNOSIS — Z79899 Other long term (current) drug therapy: Secondary | ICD-10-CM | POA: Diagnosis not present

## 2016-06-18 DIAGNOSIS — Z6834 Body mass index (BMI) 34.0-34.9, adult: Secondary | ICD-10-CM | POA: Diagnosis not present

## 2016-06-18 DIAGNOSIS — K8020 Calculus of gallbladder without cholecystitis without obstruction: Secondary | ICD-10-CM | POA: Diagnosis not present

## 2016-06-18 DIAGNOSIS — K8000 Calculus of gallbladder with acute cholecystitis without obstruction: Secondary | ICD-10-CM | POA: Diagnosis not present

## 2016-06-18 DIAGNOSIS — I10 Essential (primary) hypertension: Secondary | ICD-10-CM | POA: Diagnosis not present

## 2016-07-02 DIAGNOSIS — I119 Hypertensive heart disease without heart failure: Secondary | ICD-10-CM | POA: Diagnosis not present

## 2016-08-31 DIAGNOSIS — Z1151 Encounter for screening for human papillomavirus (HPV): Secondary | ICD-10-CM | POA: Diagnosis not present

## 2016-08-31 DIAGNOSIS — Z6834 Body mass index (BMI) 34.0-34.9, adult: Secondary | ICD-10-CM | POA: Diagnosis not present

## 2016-08-31 DIAGNOSIS — Z01419 Encounter for gynecological examination (general) (routine) without abnormal findings: Secondary | ICD-10-CM | POA: Diagnosis not present

## 2016-09-14 DIAGNOSIS — Z1211 Encounter for screening for malignant neoplasm of colon: Secondary | ICD-10-CM | POA: Diagnosis not present

## 2016-09-14 DIAGNOSIS — K5904 Chronic idiopathic constipation: Secondary | ICD-10-CM | POA: Diagnosis not present

## 2016-12-15 DIAGNOSIS — Z720 Tobacco use: Secondary | ICD-10-CM | POA: Diagnosis not present

## 2016-12-15 DIAGNOSIS — E049 Nontoxic goiter, unspecified: Secondary | ICD-10-CM | POA: Diagnosis not present

## 2016-12-15 DIAGNOSIS — R7301 Impaired fasting glucose: Secondary | ICD-10-CM | POA: Diagnosis not present

## 2016-12-15 DIAGNOSIS — I10 Essential (primary) hypertension: Secondary | ICD-10-CM | POA: Diagnosis not present

## 2016-12-15 DIAGNOSIS — Z23 Encounter for immunization: Secondary | ICD-10-CM | POA: Diagnosis not present

## 2016-12-23 DIAGNOSIS — I10 Essential (primary) hypertension: Secondary | ICD-10-CM | POA: Diagnosis not present

## 2016-12-23 DIAGNOSIS — E7800 Pure hypercholesterolemia, unspecified: Secondary | ICD-10-CM | POA: Diagnosis not present

## 2016-12-23 DIAGNOSIS — E1165 Type 2 diabetes mellitus with hyperglycemia: Secondary | ICD-10-CM | POA: Diagnosis not present

## 2016-12-23 DIAGNOSIS — E559 Vitamin D deficiency, unspecified: Secondary | ICD-10-CM | POA: Diagnosis not present

## 2017-01-17 DIAGNOSIS — I10 Essential (primary) hypertension: Secondary | ICD-10-CM | POA: Diagnosis not present

## 2017-01-17 DIAGNOSIS — E1165 Type 2 diabetes mellitus with hyperglycemia: Secondary | ICD-10-CM | POA: Diagnosis not present

## 2017-01-17 DIAGNOSIS — E7800 Pure hypercholesterolemia, unspecified: Secondary | ICD-10-CM | POA: Diagnosis not present

## 2017-01-17 DIAGNOSIS — E559 Vitamin D deficiency, unspecified: Secondary | ICD-10-CM | POA: Diagnosis not present

## 2017-01-18 ENCOUNTER — Encounter: Payer: BLUE CROSS/BLUE SHIELD | Attending: Internal Medicine | Admitting: Registered"

## 2017-01-18 ENCOUNTER — Encounter: Payer: Self-pay | Admitting: Registered"

## 2017-01-18 DIAGNOSIS — E1165 Type 2 diabetes mellitus with hyperglycemia: Secondary | ICD-10-CM | POA: Diagnosis not present

## 2017-01-18 DIAGNOSIS — Z713 Dietary counseling and surveillance: Secondary | ICD-10-CM | POA: Insufficient documentation

## 2017-01-18 DIAGNOSIS — E119 Type 2 diabetes mellitus without complications: Secondary | ICD-10-CM

## 2017-01-18 NOTE — Patient Instructions (Addendum)
Plan:   When eating carbohydrates always have a protein with it.  Aim for 3-4 Carb Choices per meal (45-60 grams)   Aim for 0-1 Carbs per snack if hungry   Fairlife milk is higher in protein, chocolate milk might be okay  Include protein with your meals and snacks  Consider reading food labels for Total Carbohydrate  Consider aiming to increase your activity level to 3-5 days week for 30-45 minutes. [place] near [employer], good parking close to trails.  Consider checking BG at alternate times per day as you would like to do to see how you are doing.   Continue taking medication as directed by MD

## 2017-01-18 NOTE — Progress Notes (Addendum)
Diabetes Self-Management Education  Visit Type: First/Initial  Appt. Start Time: 1030 Appt. End Time: 1215  01/18/2017  [name], identified by name and date of birth, is a 51 y.o. female with a diagnosis of Diabetes: Type 2.   ASSESSMENT Patient states metformin pill has come out in [facility] and patient states she has told her doctor, but because her BG numbers are lower, was told that some medication must be getting absorbed. Pt also states that she is still experiencing some diarrhea.   Pt states she has started checking her blood sugar in the morning and before dinner. Pt reports morning numbers are higher than evening numbers which run 108-130 mg/dL. Patient states she has been careful with her food choices except a few times while on vacation and saw BG 169 after choc chip cookie and 208 after popcorn.  Patient states she was taking hair, skin & nails supplement because of dark circles under her eyes. Pt states that she gets plenty of sleep. Pt states that she experiences occasional allergies and will take advil cold & sinus. Pt reports the darkness seems to be getting better since her last doctors appointment. Changes since diagnosis include new medications, stopped eating sweetened instant oatmeal, fruit, and stopped drinking soda and OJ. Pt states she loves fruit, especially grapes.  In patient's dietary recall and food preferences she mentioned a few things which are high in sodium such as Caesar salad. RD did not have time to discuss sodium and will cover at next visit.  Physical activity: Pt does not have structured activity, but states she stays busy. Pt states she was lifting weights at home daily for 30 days, but stopped 2 weeks ago. Pt states she has a pattern of having a healthy habit for 30 days and then just stopping and doesn't know why.  Pt states she also loved to walk, but stopped due to pain in legs and after having lots of tests done was not able to figure out source  of pain. Pt reports that it seems to be getting better.   01/20/17 Addendum consists of medications edits: Pt called and requested I fix the amount of a couple of medications that I entered during our initial appointment.     Diabetes Self-Management Education - 01/18/17 1052      Visit Information   Visit Type First/Initial     Initial Visit   Diabetes Type Type 2   Are you currently following a meal plan? No   Are you taking your medications as prescribed? Yes   Date Diagnosed Sept 2018     Health Coping   How would you rate your overall health? Good     Psychosocial Assessment   Patient Belief/Attitude about Diabetes Other (comment)  don't feel different   How often do you need to have someone help you when you read instructions, pamphlets, or other written materials from your doctor or pharmacy? 1 - Never   What is the last grade level you completed in school? enrolled in [employer]     Complications   Last HgB A1C per patient/outside source 7.6 %  per referral 11/2016   How often do you check your blood sugar? 3-4 times / week   Fasting Blood glucose range (mg/dL) --  130-140 usually 150 highest   Number of hypoglycemic episodes per month 0   Number of hyperglycemic episodes per week 1   Can you tell when your blood sugar is high? No   Have  you had a dilated eye exam in the past 12 months? No   Have you had a dental exam in the past 12 months? No   Are you checking your feet? No     Dietary Intake   Breakfast boiled egg, bacon, toast sometimes, strawberries   Snack (morning) none    Lunch PB & J sand OR [employer] from [place] [employer] wheat bread OR leftovers   Snack (afternoon) none   Dinner baked chicken, fish or pork chops, green beans OR casear salad, spinach, cornbread OR shrimp on salad   Snack (evening) sunflower seeds   Beverage(s) water,      Exercise   Exercise Type ADL's   How many days per week to you exercise? 0   How many minutes per day do you exercise? 0   Total  minutes per week of exercise 0     Patient Education   Previous Diabetes Education No   Disease state  Definition of diabetes, type 1 and 2, and the diagnosis of diabetes;Factors that contribute to the development of diabetes   Nutrition management  Role of diet in the treatment of diabetes and the relationship between the three main macronutrients and blood glucose level;Food label reading, portion sizes and measuring food.;Carbohydrate counting   Physical activity and exercise  Role of exercise on diabetes management, blood pressure control and cardiac health.   Monitoring Purpose and frequency of SMBG.;Identified appropriate SMBG and/or A1C goals.   Chronic complications Relationship between chronic complications and blood glucose control     Individualized Goals (developed by patient)   Nutrition Follow meal plan discussed   Physical Activity Exercise 3-5 times per week   Monitoring  test my blood glucose as discussed     Outcomes   Expected Outcomes Demonstrated interest in learning. Expect positive outcomes   Future DMSE 4-6 wks   Program Status Completed    Individualized Plan for Diabetes Self-Management Training:   Learning Objective:  Patient will have a greater understanding of diabetes self-management. Patient education plan is to attend individual and/or group sessions per assessed needs and concerns.  Patient Instructions  Plan:   When eating carbohydrates always have a protein with it.  Aim for 3-4 Carb Choices per meal (45-60 grams)   Aim for 0-1 Carbs per snack if hungry   Fairlife milk is higher in protein, chocolate milk might be okay  Include protein with your meals and snacks  Consider reading food labels for Total Carbohydrate  Consider aiming to increase your activity level to 3-5 days week for 30-45 minutes. [place] near [employer], good parking close to trails.  Consider checking BG at alternate times per day as you would like to do to  see how you are doing.   Continue taking medication as directed by MD  Expected Outcomes:  Demonstrated interest in learning. Expect positive outcomes  Education material provided: Living Well with Diabetes, A1C conversion sheet, Snack sheet and Carbohydrate counting sheet  If problems or questions, patient to contact team via:  Phone and MyChart  Future DSME appointment: 4-6 wks

## 2017-01-20 ENCOUNTER — Telehealth: Payer: Self-pay | Admitting: Registered"

## 2017-01-25 NOTE — Telephone Encounter (Signed)
Pt called back after appointment to clarify medications. RD made changes per patient and patient saw changes on MyChart and confirmed the edits were correct.

## 2017-03-01 ENCOUNTER — Ambulatory Visit: Payer: BLUE CROSS/BLUE SHIELD | Admitting: Registered"

## 2017-03-02 ENCOUNTER — Encounter: Payer: BLUE CROSS/BLUE SHIELD | Attending: Internal Medicine | Admitting: Registered"

## 2017-03-02 DIAGNOSIS — E1165 Type 2 diabetes mellitus with hyperglycemia: Secondary | ICD-10-CM | POA: Insufficient documentation

## 2017-03-02 DIAGNOSIS — Z713 Dietary counseling and surveillance: Secondary | ICD-10-CM | POA: Insufficient documentation

## 2017-03-02 DIAGNOSIS — E119 Type 2 diabetes mellitus without complications: Secondary | ICD-10-CM

## 2017-03-02 NOTE — Progress Notes (Signed)
Diabetes Self-Management Education  Visit Type: Follow-up  Appt. Start Time: 1435 Appt. End Time: 2202  03/04/2017  [name], identified by name and date of birth, is a 51 y.o. female with a diagnosis of Diabetes:  .   ASSESSMENT Patient states she is going to gym with daughter 3x week, 30 min weights & 30 cardio and reports 6 lb weight loss. Pt states her MD wants her BG under 140. Patient is proud of smoking cessation and is encouraged by the weight loss and changes in her body shape and increased energy level. Pt reports she will be getting a new A1c test in Jan.  Pt states she uses Fairlife in shakes for lunch and this will hold her until dinner. Pt is using [employer] Syntha-6 Edge protein shake powder.  Pt states when on treadmill she has some right leg pain and weakness, but this has been getting better.   Diabetes Self-Management Education - 03/02/17 1400      Visit Information   Visit Type  Follow-up      Complications   Fasting Blood glucose range (mg/dL)  70-129 a couple over 130      Dietary Intake   Breakfast  boiled egg, strawberries OR veggies, [place] sausage    Lunch  soup & sandwich OR [employer] shake with fruit (just started)    [employer] low fat sandwich    Snack (evening)  celery OR grapes & cheese OR cherry tomatoes    Beverage(s)  water      Exercise   Exercise Type  Moderate (swimming / aerobic walking)    How many days per week to you exercise?  3    How many minutes per day do you exercise?  60 30 min cardio, 30 min weights    Total minutes per week of exercise  180      Patient Education   Nutrition management   Role of diet in the treatment of diabetes and the relationship between the three main macronutrients and blood glucose level      Individualized Goals (developed by patient)   Nutrition  General guidelines for healthy choices and portions discussed    Physical Activity  Exercise 3-5 times per week      Outcomes   Expected Outcomes   Demonstrated interest in learning. Expect positive outcomes    Future DMSE  PRN    Program Status  Completed      Subsequent Visit   Since your last visit have you continued or begun to take your medications as prescribed?  Not on Medications    Since your last visit have you experienced any weight changes?  Loss    Weight Loss (lbs)  6     Individualized Plan for Diabetes Self-Management Training:   Learning Objective:  Patient will have a greater understanding of diabetes self-management. Patient education plan is to attend individual and/or group sessions per assessed needs and concerns.  Patient Instructions  Ask doctor if a referral to physical therapy would be helpful for the pain in your foot and leg. Consider taking an over the counter Vit D after your weekly high dose. Continue your great exercise routine Continue eating regular balanced meals  Expected Outcomes:  Demonstrated interest in learning. Expect positive outcomes  Education material provided: none  If problems or questions, patient to contact team via:  Phone or MyChart  Future DSME appointment: PRN

## 2017-03-02 NOTE — Patient Instructions (Addendum)
Ask doctor if a referral to physical therapy would be helpful for the pain in your foot and leg. Consider taking an over the counter Vit D after your weekly high dose prescription is completed. Continue your great exercise routine Continue eating regular balanced meals

## 2017-03-03 ENCOUNTER — Encounter: Payer: Self-pay | Admitting: Registered"

## 2017-03-04 ENCOUNTER — Encounter: Payer: Self-pay | Admitting: Registered"

## 2017-03-04 DIAGNOSIS — E119 Type 2 diabetes mellitus without complications: Secondary | ICD-10-CM | POA: Insufficient documentation

## 2017-03-04 HISTORY — DX: Type 2 diabetes mellitus without complications: E11.9

## 2017-03-25 DIAGNOSIS — E1165 Type 2 diabetes mellitus with hyperglycemia: Secondary | ICD-10-CM | POA: Diagnosis not present

## 2017-03-25 DIAGNOSIS — E559 Vitamin D deficiency, unspecified: Secondary | ICD-10-CM | POA: Diagnosis not present

## 2017-03-25 DIAGNOSIS — E049 Nontoxic goiter, unspecified: Secondary | ICD-10-CM | POA: Diagnosis not present

## 2017-03-25 DIAGNOSIS — I10 Essential (primary) hypertension: Secondary | ICD-10-CM | POA: Diagnosis not present

## 2017-04-28 ENCOUNTER — Ambulatory Visit
Admission: RE | Admit: 2017-04-28 | Discharge: 2017-04-28 | Disposition: A | Discharge status: Home / Self Care | Payer: BLUE CROSS/BLUE SHIELD | Source: Ambulatory Visit | Attending: Internal Medicine | Admitting: Internal Medicine

## 2017-04-28 ENCOUNTER — Other Ambulatory Visit: Payer: Self-pay | Admitting: Internal Medicine

## 2017-04-28 DIAGNOSIS — M25551 Pain in right hip: Secondary | ICD-10-CM | POA: Diagnosis not present

## 2017-04-28 DIAGNOSIS — M48061 Spinal stenosis, lumbar region without neurogenic claudication: Secondary | ICD-10-CM | POA: Diagnosis not present

## 2017-04-28 DIAGNOSIS — Z1231 Encounter for screening mammogram for malignant neoplasm of breast: Secondary | ICD-10-CM

## 2017-04-28 DIAGNOSIS — Z0000 Encounter for general adult medical examination without abnormal findings: Secondary | ICD-10-CM | POA: Diagnosis not present

## 2017-04-28 DIAGNOSIS — E041 Nontoxic single thyroid nodule: Secondary | ICD-10-CM

## 2017-04-28 DIAGNOSIS — E559 Vitamin D deficiency, unspecified: Secondary | ICD-10-CM | POA: Diagnosis not present

## 2017-04-28 DIAGNOSIS — M5431 Sciatica, right side: Secondary | ICD-10-CM

## 2017-04-28 DIAGNOSIS — E7800 Pure hypercholesterolemia, unspecified: Secondary | ICD-10-CM | POA: Diagnosis not present

## 2017-04-28 DIAGNOSIS — Z1211 Encounter for screening for malignant neoplasm of colon: Secondary | ICD-10-CM | POA: Diagnosis not present

## 2017-04-28 DIAGNOSIS — E1165 Type 2 diabetes mellitus with hyperglycemia: Secondary | ICD-10-CM | POA: Diagnosis not present

## 2017-05-04 ENCOUNTER — Ambulatory Visit
Admission: RE | Admit: 2017-05-04 | Discharge: 2017-05-04 | Disposition: A | Discharge status: Home / Self Care | Payer: BLUE CROSS/BLUE SHIELD | Source: Ambulatory Visit | Attending: Internal Medicine | Admitting: Internal Medicine

## 2017-05-04 DIAGNOSIS — E042 Nontoxic multinodular goiter: Secondary | ICD-10-CM | POA: Diagnosis not present

## 2017-05-04 DIAGNOSIS — E041 Nontoxic single thyroid nodule: Secondary | ICD-10-CM

## 2017-05-05 DIAGNOSIS — E041 Nontoxic single thyroid nodule: Secondary | ICD-10-CM | POA: Diagnosis not present

## 2017-05-05 DIAGNOSIS — M25751 Osteophyte, right hip: Secondary | ICD-10-CM | POA: Diagnosis not present

## 2017-05-05 DIAGNOSIS — Z720 Tobacco use: Secondary | ICD-10-CM | POA: Diagnosis not present

## 2017-05-06 ENCOUNTER — Other Ambulatory Visit: Payer: Self-pay | Admitting: Internal Medicine

## 2017-05-09 ENCOUNTER — Other Ambulatory Visit: Payer: Self-pay | Admitting: Internal Medicine

## 2017-05-09 DIAGNOSIS — E041 Nontoxic single thyroid nodule: Secondary | ICD-10-CM

## 2017-05-11 DIAGNOSIS — M5136 Other intervertebral disc degeneration, lumbar region: Secondary | ICD-10-CM | POA: Diagnosis not present

## 2017-05-16 DIAGNOSIS — M545 Low back pain: Secondary | ICD-10-CM | POA: Diagnosis not present

## 2017-05-18 ENCOUNTER — Ambulatory Visit
Admission: RE | Admit: 2017-05-18 | Discharge: 2017-05-18 | Disposition: A | Discharge status: Home / Self Care | Payer: BLUE CROSS/BLUE SHIELD | Source: Ambulatory Visit | Attending: Internal Medicine | Admitting: Internal Medicine

## 2017-05-18 ENCOUNTER — Other Ambulatory Visit (HOSPITAL_COMMUNITY)
Admission: RE | Admit: 2017-05-18 | Discharge: 2017-05-18 | Disposition: A | Discharge status: Home / Self Care | Payer: BLUE CROSS/BLUE SHIELD | Source: Ambulatory Visit | Attending: Radiology | Admitting: Radiology

## 2017-05-18 DIAGNOSIS — E041 Nontoxic single thyroid nodule: Secondary | ICD-10-CM

## 2017-05-18 DIAGNOSIS — E079 Disorder of thyroid, unspecified: Secondary | ICD-10-CM | POA: Insufficient documentation

## 2017-05-25 ENCOUNTER — Ambulatory Visit
Admission: RE | Admit: 2017-05-25 | Discharge: 2017-05-25 | Disposition: A | Discharge status: Home / Self Care | Payer: BLUE CROSS/BLUE SHIELD | Source: Ambulatory Visit | Attending: Internal Medicine | Admitting: Internal Medicine

## 2017-05-25 DIAGNOSIS — Z1231 Encounter for screening mammogram for malignant neoplasm of breast: Secondary | ICD-10-CM | POA: Diagnosis not present

## 2017-06-13 DIAGNOSIS — M545 Low back pain: Secondary | ICD-10-CM | POA: Diagnosis not present

## 2017-06-15 DIAGNOSIS — E1165 Type 2 diabetes mellitus with hyperglycemia: Secondary | ICD-10-CM | POA: Diagnosis not present

## 2017-06-15 DIAGNOSIS — E669 Obesity, unspecified: Secondary | ICD-10-CM | POA: Diagnosis not present

## 2017-06-15 DIAGNOSIS — I10 Essential (primary) hypertension: Secondary | ICD-10-CM | POA: Diagnosis not present

## 2017-06-15 DIAGNOSIS — E041 Nontoxic single thyroid nodule: Secondary | ICD-10-CM | POA: Diagnosis not present

## 2017-08-11 DIAGNOSIS — I10 Essential (primary) hypertension: Secondary | ICD-10-CM | POA: Diagnosis not present

## 2017-08-11 DIAGNOSIS — E041 Nontoxic single thyroid nodule: Secondary | ICD-10-CM | POA: Diagnosis not present

## 2017-08-11 DIAGNOSIS — E1165 Type 2 diabetes mellitus with hyperglycemia: Secondary | ICD-10-CM | POA: Diagnosis not present

## 2017-08-11 DIAGNOSIS — E669 Obesity, unspecified: Secondary | ICD-10-CM | POA: Diagnosis not present

## 2018-03-07 DIAGNOSIS — I10 Essential (primary) hypertension: Secondary | ICD-10-CM | POA: Diagnosis not present

## 2018-03-09 DIAGNOSIS — I10 Essential (primary) hypertension: Secondary | ICD-10-CM | POA: Diagnosis not present

## 2018-03-09 DIAGNOSIS — E1165 Type 2 diabetes mellitus with hyperglycemia: Secondary | ICD-10-CM | POA: Diagnosis not present

## 2018-05-20 MED FILL — METOPROLOL SUCCINATE ER 50: 50 | 60 days supply | Qty: 60 | Fill #0

## 2018-05-24 ENCOUNTER — Other Ambulatory Visit: Payer: Self-pay | Admitting: Internal Medicine

## 2018-05-24 DIAGNOSIS — Z720 Tobacco use: Secondary | ICD-10-CM | POA: Diagnosis not present

## 2018-05-24 DIAGNOSIS — I10 Essential (primary) hypertension: Secondary | ICD-10-CM | POA: Diagnosis not present

## 2018-05-24 DIAGNOSIS — E7800 Pure hypercholesterolemia, unspecified: Secondary | ICD-10-CM | POA: Diagnosis not present

## 2018-05-24 DIAGNOSIS — Z0000 Encounter for general adult medical examination without abnormal findings: Secondary | ICD-10-CM | POA: Diagnosis not present

## 2018-05-24 DIAGNOSIS — Z1231 Encounter for screening mammogram for malignant neoplasm of breast: Secondary | ICD-10-CM

## 2018-05-24 DIAGNOSIS — E041 Nontoxic single thyroid nodule: Secondary | ICD-10-CM

## 2018-05-24 DIAGNOSIS — E042 Nontoxic multinodular goiter: Secondary | ICD-10-CM | POA: Diagnosis not present

## 2018-05-24 DIAGNOSIS — E1165 Type 2 diabetes mellitus with hyperglycemia: Secondary | ICD-10-CM | POA: Diagnosis not present

## 2018-05-29 ENCOUNTER — Other Ambulatory Visit: Payer: Self-pay | Admitting: Internal Medicine

## 2018-05-29 DIAGNOSIS — Z720 Tobacco use: Secondary | ICD-10-CM

## 2018-05-31 ENCOUNTER — Other Ambulatory Visit: Payer: Self-pay

## 2018-05-31 ENCOUNTER — Ambulatory Visit
Admission: RE | Admit: 2018-05-31 | Discharge: 2018-05-31 | Disposition: A | Discharge status: Home / Self Care | Payer: 59 | Source: Ambulatory Visit | Attending: Internal Medicine | Admitting: Internal Medicine

## 2018-05-31 DIAGNOSIS — E041 Nontoxic single thyroid nodule: Secondary | ICD-10-CM

## 2018-05-31 DIAGNOSIS — E042 Nontoxic multinodular goiter: Secondary | ICD-10-CM | POA: Diagnosis not present

## 2018-06-05 ENCOUNTER — Other Ambulatory Visit: Payer: Self-pay | Admitting: Internal Medicine

## 2018-06-05 DIAGNOSIS — E041 Nontoxic single thyroid nodule: Secondary | ICD-10-CM

## 2018-06-06 ENCOUNTER — Other Ambulatory Visit: Payer: Self-pay | Admitting: Cardiology

## 2018-06-08 ENCOUNTER — Other Ambulatory Visit: Payer: 59

## 2018-06-21 MED FILL — POTASSIUM CHL ER M10 TABLET: 10 | 60 days supply | Qty: 60 | Fill #0

## 2018-06-21 MED FILL — metFORMIN HCL ER 500 MG TB2: 500 | 90 days supply | Qty: 90 | Fill #0

## 2018-06-21 MED FILL — HYDROCHLOROTHIAZIDE 12.5 MG: 12.5 | 60 days supply | Qty: 60 | Fill #0

## 2018-06-21 MED FILL — CARTIA XT 240 MG CAPSULE SA: 240 | 90 days supply | Qty: 90 | Fill #0

## 2018-06-22 DIAGNOSIS — M5136 Other intervertebral disc degeneration, lumbar region: Secondary | ICD-10-CM | POA: Diagnosis not present

## 2018-06-22 DIAGNOSIS — M545 Low back pain: Secondary | ICD-10-CM | POA: Diagnosis not present

## 2018-06-28 ENCOUNTER — Ambulatory Visit: Payer: BLUE CROSS/BLUE SHIELD

## 2018-07-12 MED FILL — METOPROLOL SUCCINATE ER 50: 50 | 60 days supply | Qty: 60 | Fill #0

## 2018-07-19 ENCOUNTER — Other Ambulatory Visit: Payer: 59

## 2018-08-14 ENCOUNTER — Encounter: Payer: Self-pay | Admitting: Physician Assistant

## 2018-08-14 ENCOUNTER — Telehealth: Payer: 59 | Admitting: Physician Assistant

## 2018-08-14 DIAGNOSIS — M545 Low back pain, unspecified: Secondary | ICD-10-CM

## 2018-08-14 MED ORDER — CYCLOBENZAPRINE HCL 10 MG PO TABS: 10.0000 mg | ORAL_TABLET | Freq: Three times a day (TID) | ORAL | 0 refills | Status: DC | PRN

## 2018-08-14 MED ORDER — NAPROXEN 500 MG PO TABS: 500.0000 mg | ORAL_TABLET | Freq: Two times a day (BID) | ORAL | 0 refills | Status: DC

## 2018-08-14 NOTE — Progress Notes (Signed)

## 2018-08-23 ENCOUNTER — Other Ambulatory Visit: Payer: Self-pay

## 2018-08-23 ENCOUNTER — Other Ambulatory Visit: Payer: Self-pay | Admitting: Internal Medicine

## 2018-08-23 ENCOUNTER — Ambulatory Visit
Admission: RE | Admit: 2018-08-23 | Discharge: 2018-08-23 | Disposition: A | Discharge status: Home / Self Care | Payer: 59 | Source: Ambulatory Visit | Attending: Internal Medicine | Admitting: Internal Medicine

## 2018-08-23 DIAGNOSIS — Z1231 Encounter for screening mammogram for malignant neoplasm of breast: Secondary | ICD-10-CM | POA: Diagnosis not present

## 2018-08-29 ENCOUNTER — Other Ambulatory Visit: Payer: 59

## 2018-08-31 ENCOUNTER — Other Ambulatory Visit (HOSPITAL_COMMUNITY)
Admission: RE | Admit: 2018-08-31 | Discharge: 2018-08-31 | Disposition: A | Discharge status: Home / Self Care | Payer: 59 | Source: Ambulatory Visit | Attending: Radiology | Admitting: Radiology

## 2018-08-31 ENCOUNTER — Ambulatory Visit
Admission: RE | Admit: 2018-08-31 | Discharge: 2018-08-31 | Disposition: A | Discharge status: Home / Self Care | Payer: 59 | Source: Ambulatory Visit | Attending: Internal Medicine | Admitting: Internal Medicine

## 2018-08-31 DIAGNOSIS — E041 Nontoxic single thyroid nodule: Secondary | ICD-10-CM

## 2018-09-04 DIAGNOSIS — E049 Nontoxic goiter, unspecified: Secondary | ICD-10-CM | POA: Diagnosis not present

## 2018-09-04 DIAGNOSIS — E042 Nontoxic multinodular goiter: Secondary | ICD-10-CM | POA: Diagnosis not present

## 2018-09-06 DIAGNOSIS — E1169 Type 2 diabetes mellitus with other specified complication: Secondary | ICD-10-CM | POA: Diagnosis not present

## 2018-09-06 DIAGNOSIS — E042 Nontoxic multinodular goiter: Secondary | ICD-10-CM | POA: Diagnosis not present

## 2018-09-06 DIAGNOSIS — R079 Chest pain, unspecified: Secondary | ICD-10-CM | POA: Diagnosis not present

## 2018-09-06 DIAGNOSIS — Z720 Tobacco use: Secondary | ICD-10-CM | POA: Diagnosis not present

## 2018-09-06 DIAGNOSIS — I10 Essential (primary) hypertension: Secondary | ICD-10-CM | POA: Diagnosis not present

## 2018-09-06 DIAGNOSIS — E7800 Pure hypercholesterolemia, unspecified: Secondary | ICD-10-CM | POA: Diagnosis not present

## 2018-09-06 MED FILL — HYDROCHLOROTHIAZIDE 25 MG T: 25 | 90 days supply | Qty: 90 | Fill #0

## 2018-09-06 MED FILL — PRAVASTATIN NA 10 MG TAB: 10 | 30 days supply | Qty: 30 | Fill #0

## 2018-09-11 MED FILL — METOPROLOL SUCCINATE ER 50: 50 | 60 days supply | Qty: 60 | Fill #0

## 2018-09-12 MED FILL — POTASSIUM CHLORIDE CRYS ER: 10 | 90 days supply | Qty: 180 | Fill #0

## 2018-09-18 MED FILL — CARTIA XT 240 MG CAPSULE SA: 240 | 90 days supply | Qty: 90 | Fill #1

## 2018-09-27 ENCOUNTER — Other Ambulatory Visit: Payer: Self-pay | Admitting: Internal Medicine

## 2018-09-27 DIAGNOSIS — Z720 Tobacco use: Secondary | ICD-10-CM

## 2018-09-29 MED FILL — METFORMIN HCL ER 500 MG TB2: 500 | 30 days supply | Qty: 30 | Fill #1

## 2018-10-05 DIAGNOSIS — E7800 Pure hypercholesterolemia, unspecified: Secondary | ICD-10-CM | POA: Diagnosis not present

## 2018-10-09 MED FILL — PRAVASTATIN NA 10 MG TAB: 10 | 30 days supply | Qty: 30 | Fill #1

## 2018-10-29 IMAGING — US US FNA BIOPSY THYROID 1ST LESION
1 series · 13 of 13 positions shown · non-contrast
Comparison: US Thyroid 05/04/17

MEDICATIONS:
5 cc 1% lidocaine

COMPLICATIONS:
None immediate.

INDICATION: Indeterminate thyroid nodule

Left inferior thyroid nodule
5.2 cm
EXAM:
ULTRASOUND GUIDED FINE NEEDLE ASPIRATION OF INDETERMINATE THYROID
NODULE
TECHNIQUE: Informed written consent was obtained from the patient after a
discussion of the risks, benefits and alternatives to treatment.
Questions regarding the procedure were encouraged and answered. A
timeout was performed prior to the initiation of the procedure.

[Series 1: us fna biopsy thyroid 1st lesion · 0.10mm/px · 13 acquisitions, 13 frames shown]
[im 1/13]
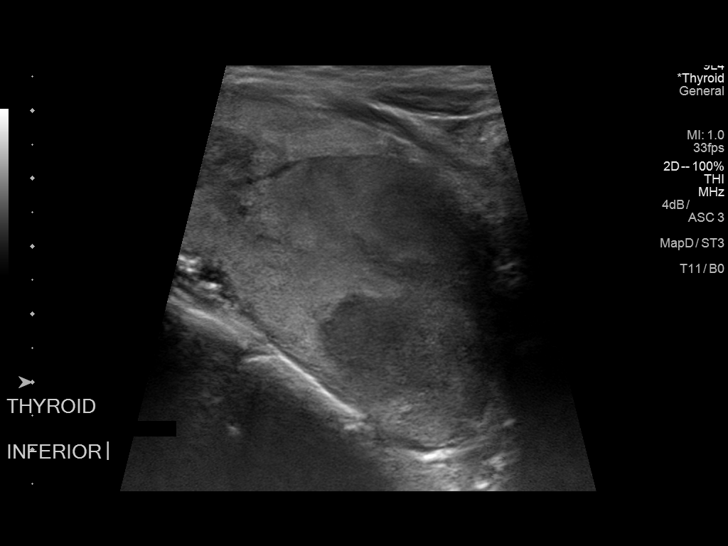
[im 2/13]
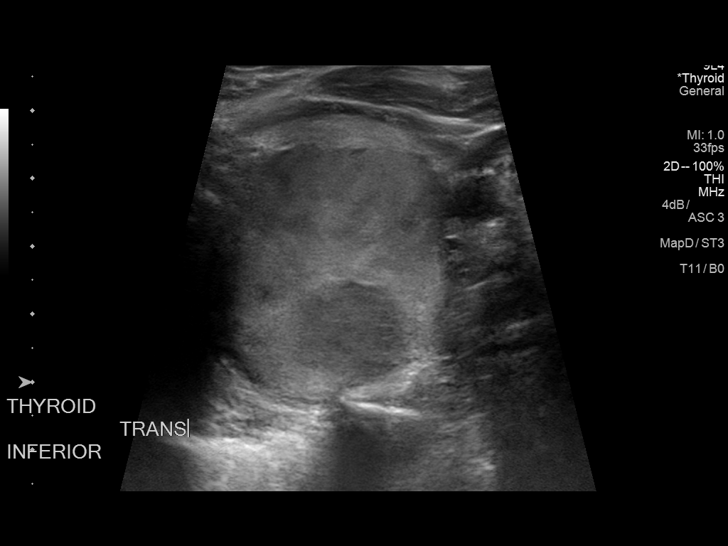
[im 3/13]
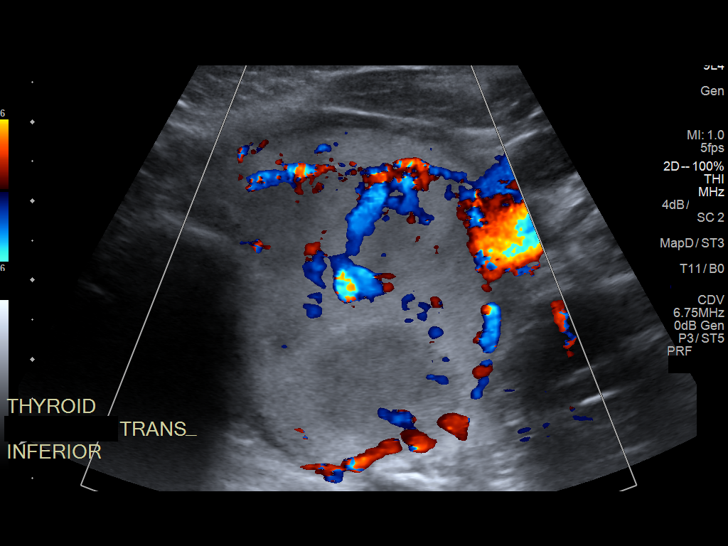
[im 4/13]
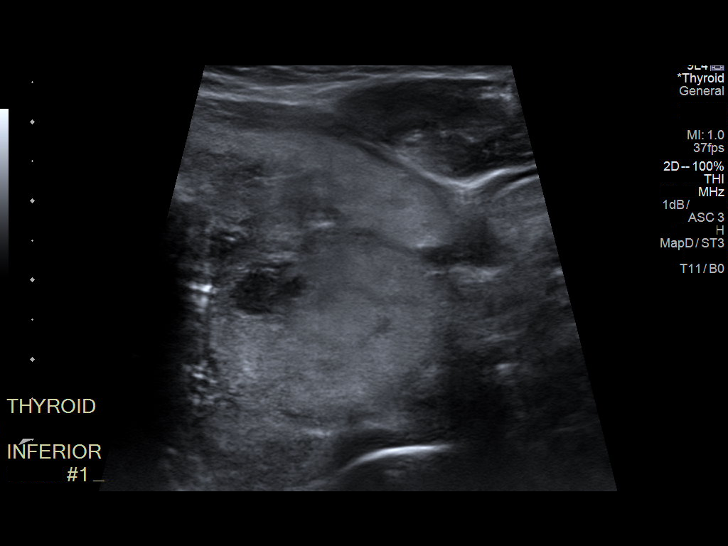
[im 5/13]
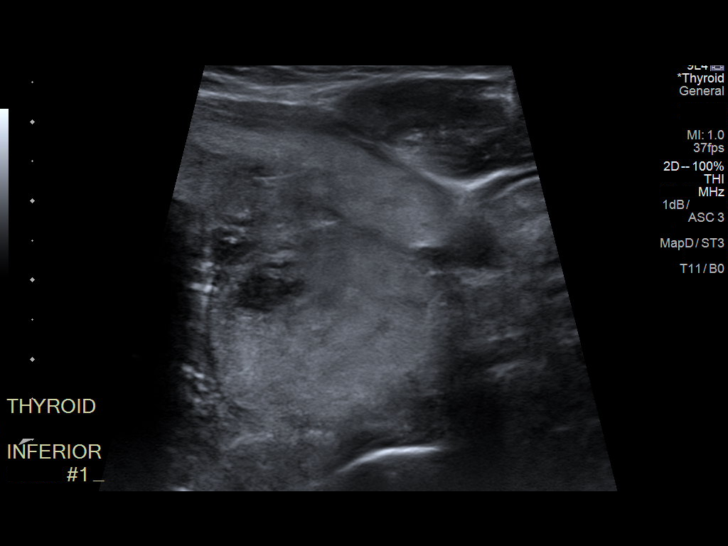
[im 6/13]
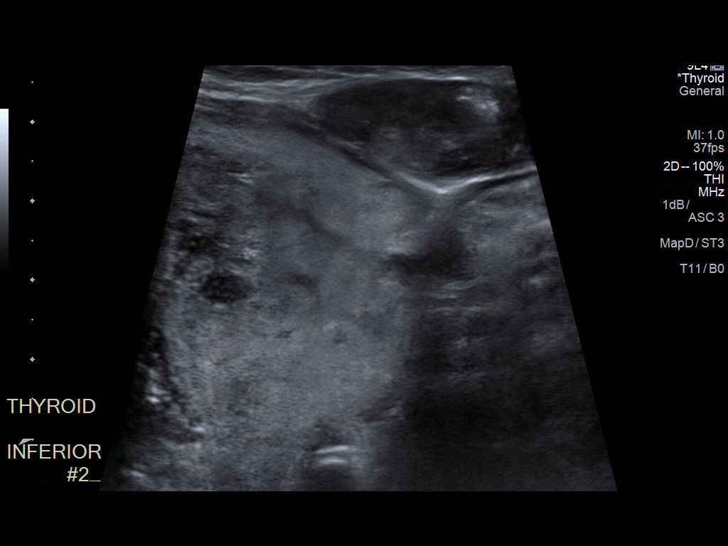
[im 7/13]
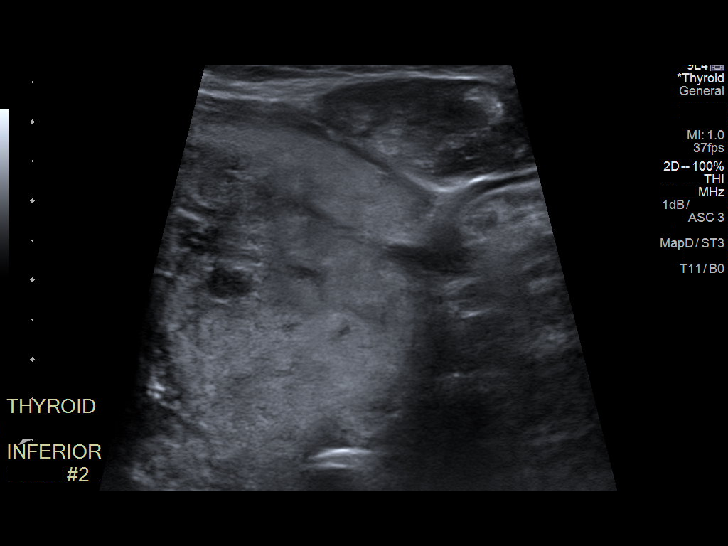
[im 8/13]
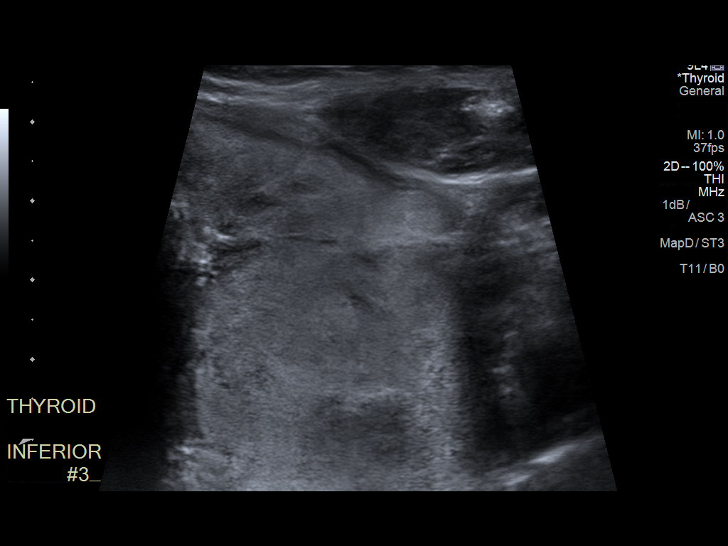
[im 9/13]
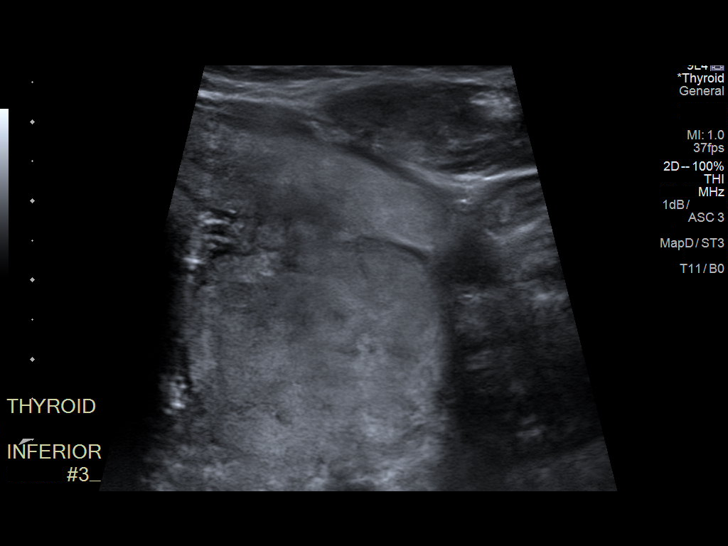
[im 10/13]
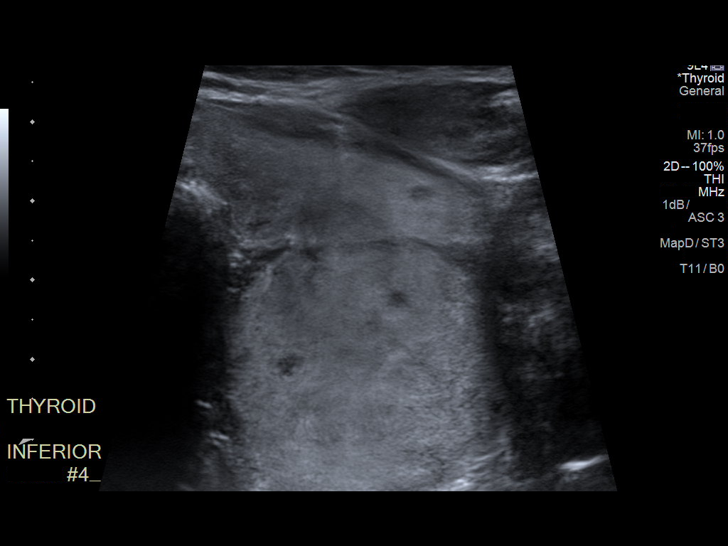
[im 11/13]
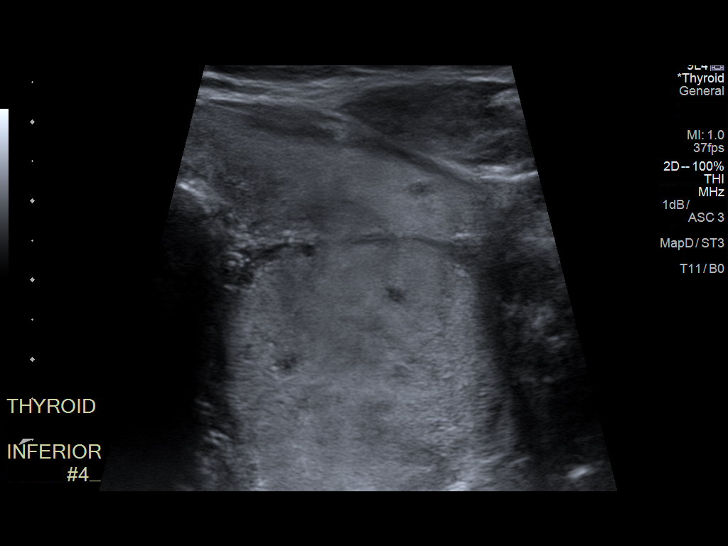
[im 12/13]
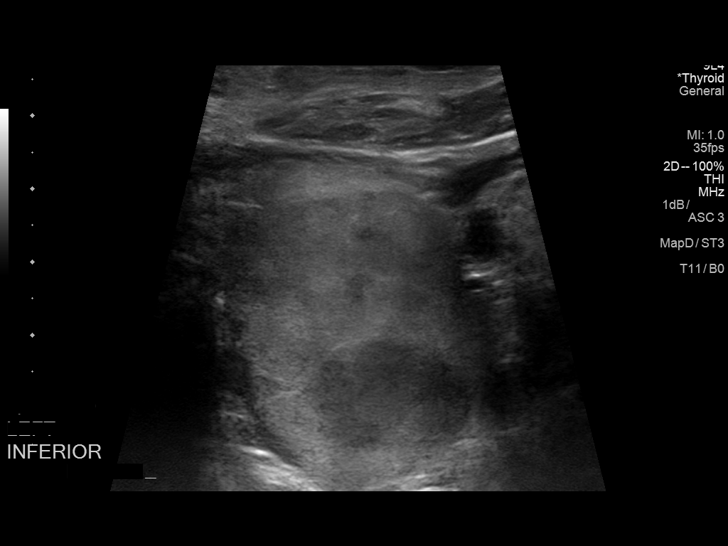
[im 13/13]
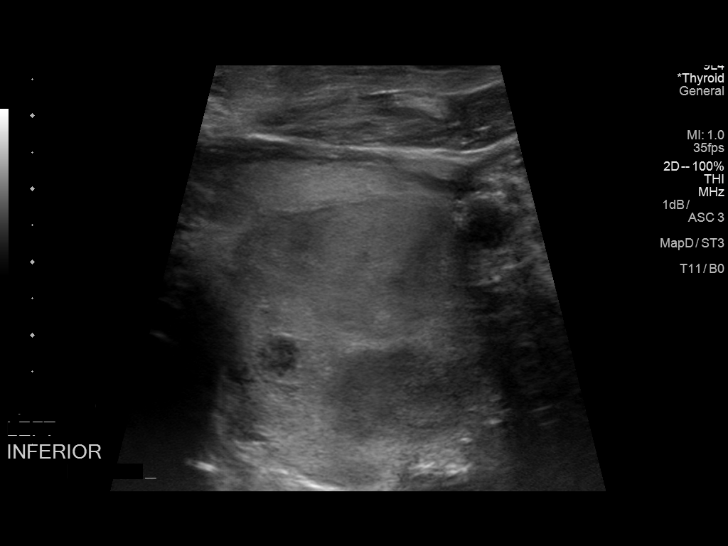

[13 of 13 positions shown; findings below may reference images not displayed]

Pre-procedural ultrasound scanning demonstrated unchanged size and
appearance of the indeterminate nodule within the left thyroid

The procedure was planned. The neck was prepped in the usual sterile
fashion, and a sterile drape was applied covering the operative
field. A timeout was performed prior to the initiation of the
procedure. Local anesthesia was provided with 1% lidocaine.

Under direct ultrasound guidance, 4 FNA biopsies were performed of
the left inferior thyroid nodule with a 25 gauge needle. Multiple
ultrasound images were saved for procedural documentation purposes.
The samples were prepared and submitted to pathology.

Limited post procedural scanning was negative for hematoma or
additional complication. Dressings were placed. The patient
tolerated the above procedures procedure well without immediate
postprocedural complication.
FINDINGS: Nodule reference number based on prior diagnostic ultrasound: 8

Maximum size: 5.2 cm

Location: Left; Inferior

ACR TI-RADS risk category: TR5 (>/= 7 points)

Reason for biopsy: meets ACR TI-RADS criteria

Ultrasound imaging confirms appropriate placement of the needles
within the thyroid nodule.
IMPRESSION: Technically successful ultrasound guided fine needle aspiration of
left inferior thyroid nodule

Read by

Bamme Nathansen

## 2018-11-03 MED FILL — metFORMIN HCL ER 500 MG TB2: 500 | 30 days supply | Qty: 30 | Fill #2

## 2018-11-03 MED FILL — PRAVASTATIN NA 10 MG TAB: 10 | 30 days supply | Qty: 30 | Fill #2

## 2018-11-06 ENCOUNTER — Ambulatory Visit
Admission: RE | Admit: 2018-11-06 | Discharge: 2018-11-06 | Disposition: A | Discharge status: Home / Self Care | Payer: 59 | Source: Ambulatory Visit | Attending: Internal Medicine | Admitting: Internal Medicine

## 2018-11-06 DIAGNOSIS — Z87891 Personal history of nicotine dependence: Secondary | ICD-10-CM | POA: Diagnosis not present

## 2018-11-06 DIAGNOSIS — Z720 Tobacco use: Secondary | ICD-10-CM

## 2018-11-07 DIAGNOSIS — I10 Essential (primary) hypertension: Secondary | ICD-10-CM | POA: Diagnosis not present

## 2018-11-07 DIAGNOSIS — E1169 Type 2 diabetes mellitus with other specified complication: Secondary | ICD-10-CM | POA: Diagnosis not present

## 2018-11-07 DIAGNOSIS — E7800 Pure hypercholesterolemia, unspecified: Secondary | ICD-10-CM | POA: Diagnosis not present

## 2018-11-14 MED FILL — METOPROLOL SUCCINATE ER 50: 50 | 60 days supply | Qty: 60 | Fill #1

## 2018-11-20 ENCOUNTER — Ambulatory Visit (INDEPENDENT_AMBULATORY_CARE_PROVIDER_SITE_OTHER): Payer: 59 | Admitting: Internal Medicine

## 2018-11-20 ENCOUNTER — Encounter: Payer: Self-pay | Admitting: Internal Medicine

## 2018-11-20 ENCOUNTER — Other Ambulatory Visit: Payer: Self-pay

## 2018-11-20 VITALS — BP 128/74 | HR 60 | Ht 64.0 in | Wt 212.8 lb

## 2018-11-20 DIAGNOSIS — E785 Hyperlipidemia, unspecified: Secondary | ICD-10-CM

## 2018-11-20 DIAGNOSIS — I10 Essential (primary) hypertension: Secondary | ICD-10-CM | POA: Diagnosis not present

## 2018-11-20 MED ORDER — ASPIRIN EC 81 MG PO TBEC: 81.0000 mg | DELAYED_RELEASE_TABLET | Freq: Every day | ORAL | 3 refills | Status: AC

## 2018-11-20 MED ORDER — PRAVASTATIN SODIUM 20 MG PO TABS: 20.0000 mg | ORAL_TABLET | Freq: Every evening | ORAL | 3 refills | Status: DC

## 2018-11-20 MED FILL — PRAVASTATIN SODIUM 20 MG TA: 20 | 90 days supply | Qty: 90 | Fill #0

## 2018-11-20 NOTE — Progress Notes (Signed)
Cardiology Office Note   Date:  11/20/2018   ID:  Cindy Clark, DOB 26-May-1965, MRN [id number]  PCP:  Leeroy Cha, MD  Cardiologist:   Dorris Carnes, MD   Pt referred for evaluation of CP     History of Present Illness: Cindy Clark is a 53 y.o. female with a history of HTN, DM   Followed by Dr Fara Olden  Seen in June   BP was elevated at that time     FHx of CAD  Brother has CAD and has had PTCA/stents    Patient has CP on and off  Experiences a tightness  Spells occur with and without activity Spells with go right away  One time had for a day on and off  Occur 2x per year   Begain a few years ago     Feels fine  Otherwise  R leg has some problems  About 2 years   Worse with walking  LE arterial study normal in 2017    ON metformin     Pt stopped smoking 5 years ago    Current Meds  Medication Sig  . Cholecalciferol (VITAMIN D) 50 MCG (2000 UT) tablet Take 2,000 Units by mouth daily.  Marland Kitchen diltiazem (CARTIA XT) 240 MG 24 hr capsule Take 240 mg by mouth daily.  . hydrochlorothiazide (HYDRODIURIL) 25 MG tablet Take 25 mg by mouth daily.  . Loratadine (CLARITIN PO) Take by mouth.  . metFORMIN (GLUCOPHAGE) 500 MG tablet Take 500 mg by mouth daily.   . Metoprolol Succinate 50 MG CS24 Take by mouth.  . potassium chloride (KLOR-CON) 20 MEQ packet Take by mouth daily.   . pravastatin (PRAVACHOL) 10 MG tablet Take 10 mg by mouth daily.     Allergies:   Wellbutrin [bupropion]   Past Medical History:  Diagnosis Date  . Chest pain   . Elevated blood pressure   . Goiter   . Heart murmur    when pregnant - told was normal  . Hypertension   . Multinodular goiter   . Newly diagnosed diabetes ([place]) 03/04/2017  . Obesity (BMI 30.0-34.9)   . Osteophyte, right hip   . Pure hypercholesterolemia   . Sciatica of right side   . Thyroid disease    reports seen by endocrinologist in the past for thyroid nodules, biopsy and told benign and told nothing further  needed  . Thyroid nodule   . Tobacco use disorder 02/26/2014  . Trigger middle finger of left hand 04/04/2015   Injected under ultrasound 04/04/2015   . Trigger thumb of left hand 04/04/2015   Injected 04/04/2015 Repeat injection 06/09/2015   . Type II diabetes mellitus with complication, uncontrolled ([place])   . Vitamin D deficiency     Past Surgical History:  Procedure Laterality Date  . ECTOPIC PREGNANCY SURGERY    . FACIAL COSMETIC SURGERY    . FACIAL COSMETIC SURGERY       Social History:  The patient  reports that she quit smoking about 5 years ago. Her smoking use included cigarettes. She has a 30.00 pack-year smoking history. She has never used smokeless tobacco. She reports current alcohol use. She reports that she does not use drugs.   Family History:  [name] family history includes Diabetes in her brother; Heart disease (age of onset: 11) in her mother; Hypertension in her brother and mother.    ROS:  Please see the history of present illness. All other systems are reviewed and  Negative to the above problem except as noted.    PHYSICAL EXAM: VS:  BP 128/74   Pulse 60   Ht 5\' 4"  (1.626 m)   Wt 212 lb 12.8 oz (96.5 kg)   BMI 36.53 kg/m   GEN: Obese 53 yo , in no acute distress  HEENT: normal  Neck: no JVD, carotid bruits, or masses Cardiac: RRR; no murmurs, rubs, or gallops,no edema  Respiratory:  clear to auscultation bilaterally, normal work of breathing GI: soft, nontender, nondistended, + BS  No hepatomegaly  MS: no deformity Moving all extremities   Skin: warm and dry, no rash Neuro:  Strength and sensation are intact Psych: euthymic mood, full affect   EKG:  EKG is ordered today.  SR 60 bpm   NOnspecific ST T wave changes   Occasional PAC      Lipid Panel    Component Value Date/Time   CHOL 222 (H) 05/29/2015 0949   TRIG 135.0 05/29/2015 0949   HDL 56.30 05/29/2015 0949   CHOLHDL 4 05/29/2015 0949   VLDL 27.0 05/29/2015 0949   LDLCALC 139 (H)  05/29/2015 0949      Wt Readings from Last 3 Encounters:  11/20/18 212 lb 12.8 oz (96.5 kg)  06/09/15 203 lb (92.1 kg)  05/29/15 199 lb 8 oz (90.5 kg)      ASSESSMENT AND PLAN:  1  Chest pain  Atypical for cardiac    Very infrequent I have reviewed chest CT   There does not appear to be severe atheroscerlosis, but present  Recomm: treat preventively   ecASA given DM, HTN and plaquing Lipids control with LDL goal 70 or less  Increase pravstatin  2  HTN   Adquate contro  3  HL  Increase pravachol  Follow up labs in 2 months  I will set to see pt at end of spring  [place] if CP symptoms change (more frequent, with SOB , with activity)    Current medicines are reviewed at length with the patient today.  The patient does not have concerns regarding medicines.  Signed, Dorris Carnes, MD  11/20/2018 1:55 PM    [address], [place], [place]  [address] Phone: [phone]; Fax: [phone]

## 2018-11-20 NOTE — Patient Instructions (Signed)
Medication Instructions:  Your physician has recommended you make the following change in your medication:  1.) start aspirin 81 mg (enteric coated) - once a day 2.) increase pravastatin to 20 mg once a day  If you need a refill on your cardiac medications before your next appointment, please call your pharmacy.   Lab work: In about 8 weeks - Lipids/ast If you have labs (blood work) drawn today and your tests are completely normal, you will receive your results only by: Marland Kitchen MyChart Message (if you have MyChart) OR . A paper copy in the mail If you have any lab test that is abnormal or we need to change your treatment, we will call you to review the results.  Testing/Procedures: none  Follow-Up: At [facility], you and your health needs are our priority.  As part of our continuing mission to provide you with exceptional heart care, we have created designated Provider Care Teams.  These Care Teams include your primary Cardiologist (physician) and Advanced Practice Providers (APPs -  Physician Assistants and Nurse Practitioners) who all work together to provide you with the care you need, when you need it. You will need a follow up appointment in:  8 months.  Please call our office 2 months in advance to schedule this appointment.  You may see Dorris Carnes, MD or one of the following Advanced Practice Providers on your designated Care Team: Richardson Dopp, PA-C [place], [place] . Daune Perch, NP  Any Other Special Instructions Will Be Listed Below (If Applicable).

## 2018-12-05 MED FILL — metFORMIN HCL ER 500 MG TB2: 500 | 30 days supply | Qty: 30 | Fill #3

## 2018-12-14 MED FILL — CARTIA XT 240 MG CAPSULE SA: 240 | 90 days supply | Qty: 90 | Fill #0

## 2018-12-14 MED FILL — HYDROCHLOROTHIAZIDE 25 MG T: 25 | 90 days supply | Qty: 90 | Fill #0

## 2018-12-14 MED FILL — POTASSIUM CHLORIDE CRYS ER: 10 | 90 days supply | Qty: 180 | Fill #1

## 2018-12-24 IMAGING — DX DG LUMBAR SPINE 2-3V
3 series · 3 of 3 positions shown · non-contrast
Comparison: None.

CLINICAL DATA: Low back pain radiating into the right leg for
several days

EXAM:
LUMBAR SPINE - 3 VIEW

[dg lumbar spine 2-3 views (1 of 3)]
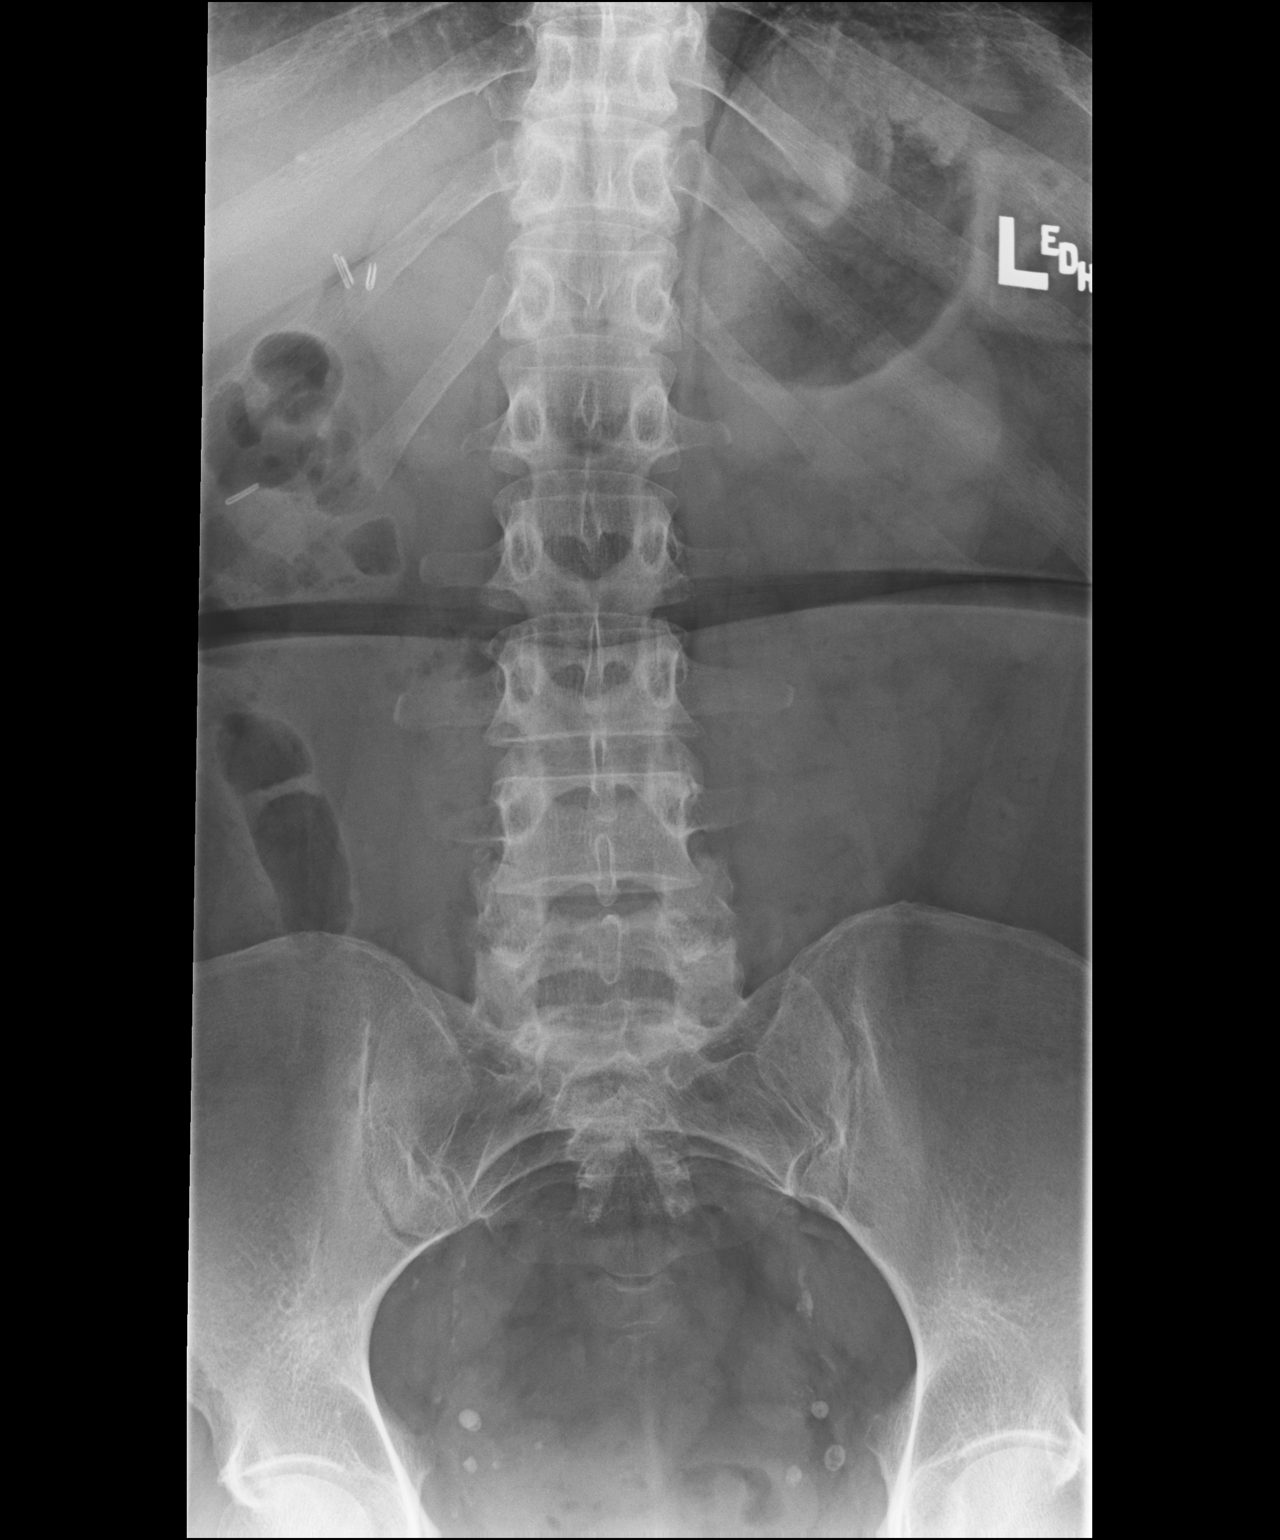

[dg lumbar spine 2-3 views (2 of 3)]
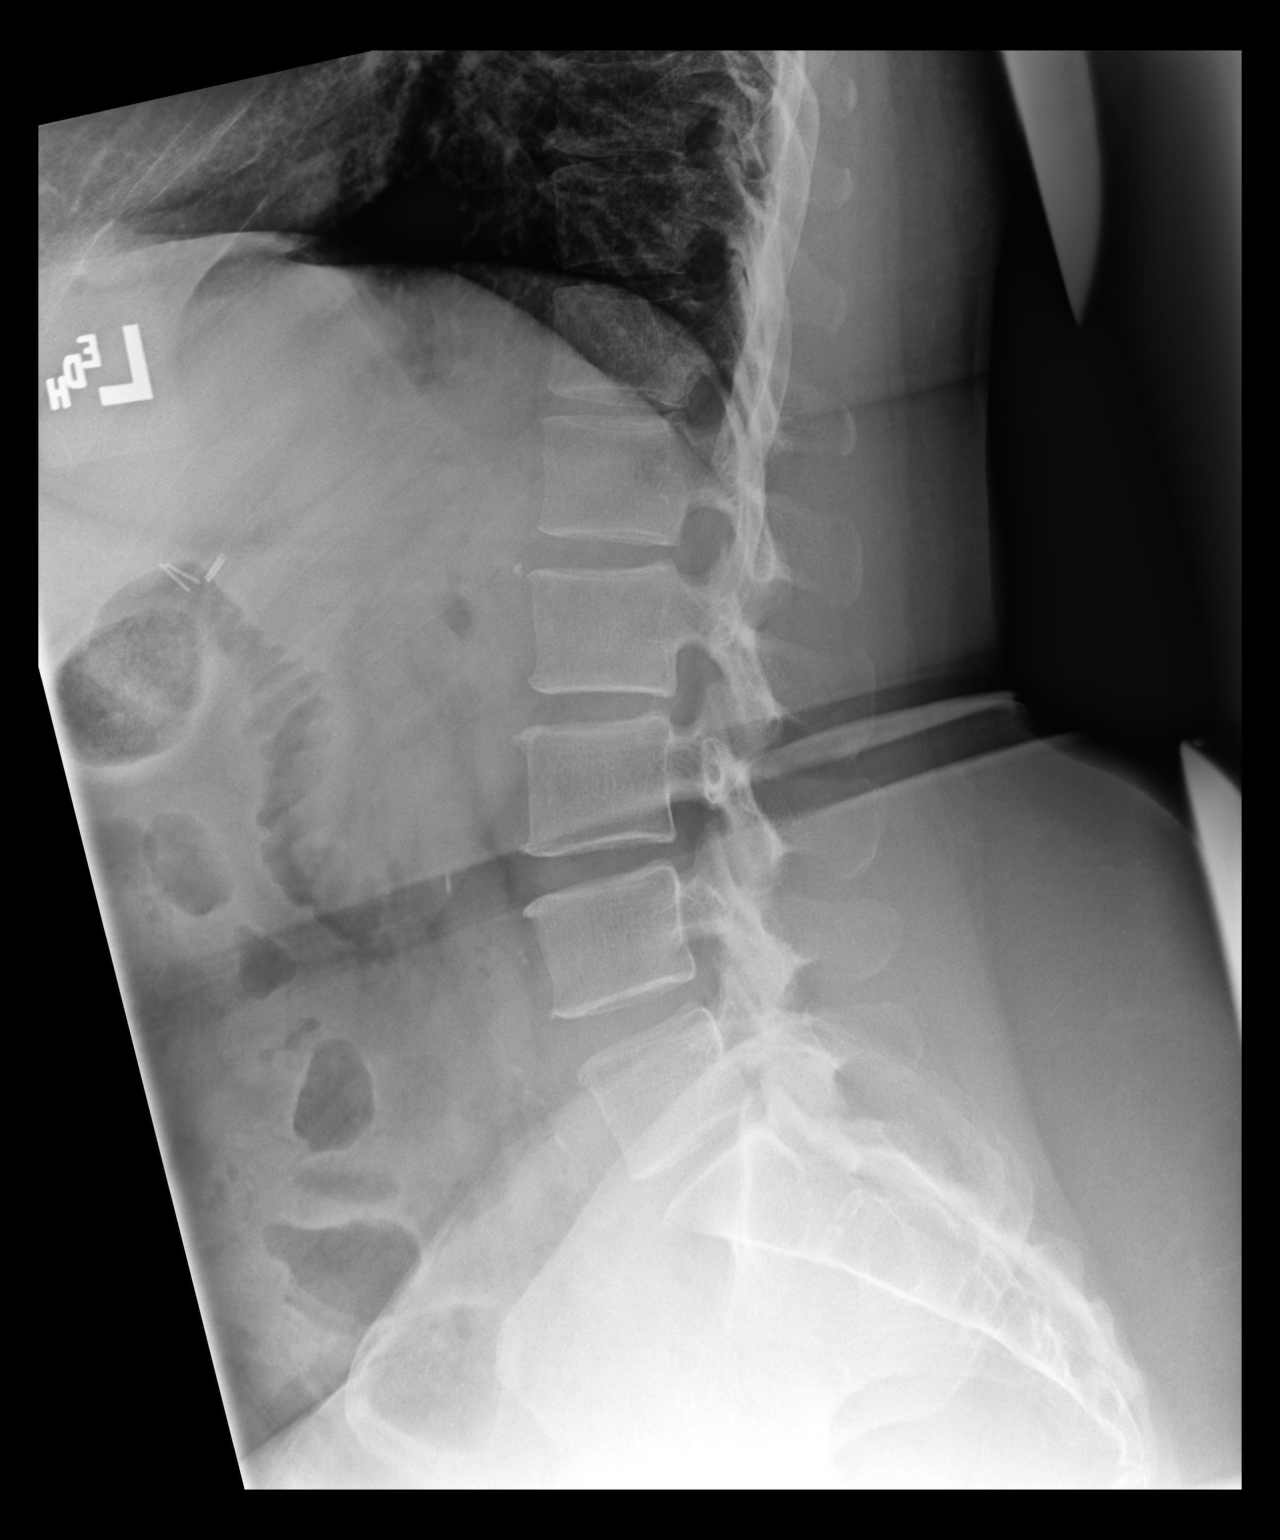

[dg lumbar spine 2-3 views (3 of 3)]
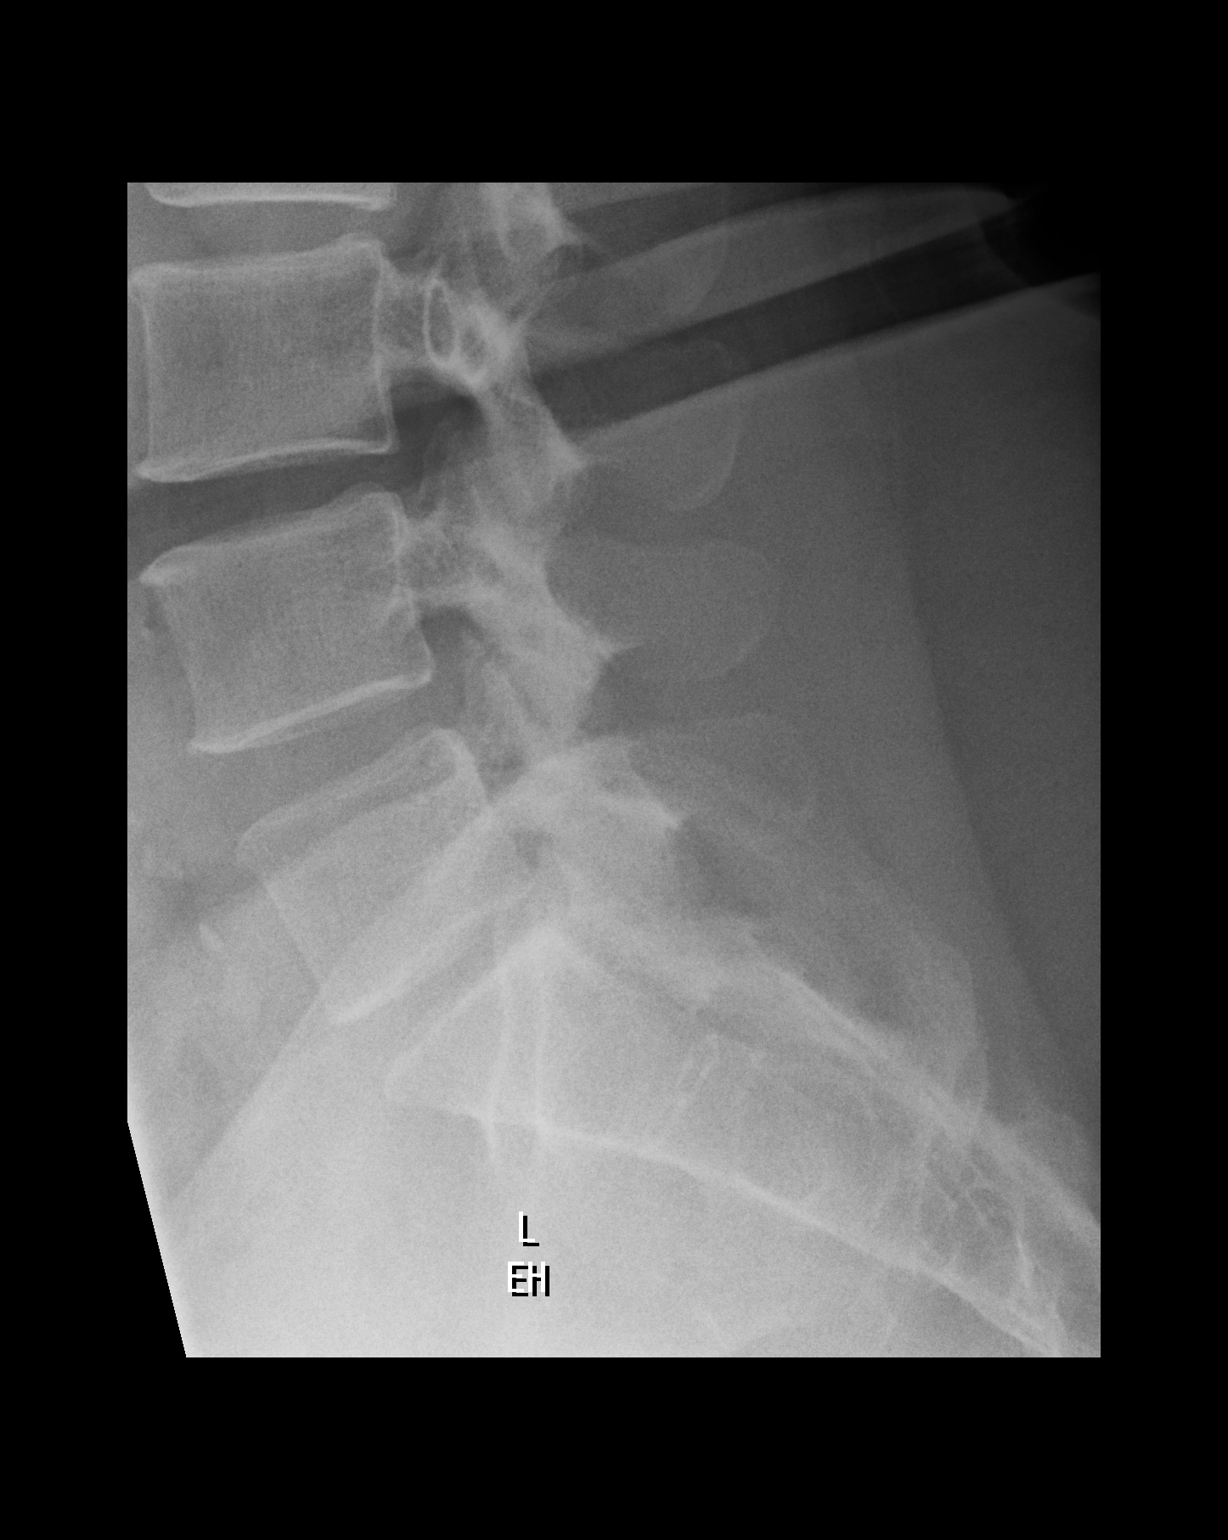

[3 of 3 positions shown; findings below may reference images not displayed]

FINDINGS: Five lumbar type vertebral bodies are well visualized. Vertebral
body height is well maintained. No anterolisthesis is seen. Minimal
osteophytic changes are noted. Mild disc space narrowing is noted
L5-S1.
IMPRESSION: Mild degenerative change without acute abnormality.

## 2019-01-04 MED FILL — METOPROLOL SUCCINATE ER 50: 50 | 60 days supply | Qty: 60 | Fill #0

## 2019-01-04 MED FILL — metFORMIN HCL ER 500 MG TB2: 500 | 90 days supply | Qty: 90 | Fill #0

## 2019-01-19 ENCOUNTER — Other Ambulatory Visit: Payer: Self-pay

## 2019-01-19 ENCOUNTER — Telehealth: Payer: Self-pay | Admitting: Internal Medicine

## 2019-01-19 ENCOUNTER — Other Ambulatory Visit: Payer: 59 | Admitting: *Deleted

## 2019-01-19 NOTE — Telephone Encounter (Signed)
New message:     Patient calling and refused not to tell me the reason she was calling. Patient was very uncomfortable telling me what she wanted concering her medical issuers. Please call patient.

## 2019-01-19 NOTE — Telephone Encounter (Signed)
I spoke with the patient. She was here this morning for lab work and left because she was told she didn't have an appointment.  After that, she received a call from our office and her lab appointment was rescheduled.  No further needs at this time.

## 2019-01-22 ENCOUNTER — Other Ambulatory Visit: Payer: Self-pay | Admitting: *Deleted

## 2019-01-22 DIAGNOSIS — E785 Hyperlipidemia, unspecified: Secondary | ICD-10-CM

## 2019-01-30 ENCOUNTER — Other Ambulatory Visit: Payer: 59

## 2019-01-30 ENCOUNTER — Other Ambulatory Visit: Payer: Self-pay

## 2019-01-30 DIAGNOSIS — E785 Hyperlipidemia, unspecified: Secondary | ICD-10-CM

## 2019-01-30 LAB — LIPID PANEL
Chol/HDL Ratio: 3 ratio (ref 0.0–4.4)
Cholesterol, Total: 165 mg/dL (ref 100–199)
HDL: 55 mg/dL (ref >39)
LDL Chol Calc (NIH): 90 mg/dL (ref 0–99)
Triglycerides: 114 mg/dL (ref 0–149)
VLDL Cholesterol Cal: 20 mg/dL (ref 5–40)

## 2019-01-30 LAB — AST: AST: 14 IU/L (ref 0–40)

## 2019-02-01 ENCOUNTER — Telehealth: Payer: Self-pay

## 2019-02-01 DIAGNOSIS — E785 Hyperlipidemia, unspecified: Secondary | ICD-10-CM

## 2019-02-01 MED ORDER — PRAVASTATIN SODIUM 40 MG PO TABS: 40.0000 mg | ORAL_TABLET | Freq: Every day | ORAL | 3 refills | Status: DC

## 2019-02-01 NOTE — Telephone Encounter (Signed)
The patient has been notified of the result and verbalized understanding.  All questions (if any) were answered.  Patient will increase pravastatin to 40 mg daily and repeat labs in 8 weeks.

## 2019-02-01 NOTE — Telephone Encounter (Signed)
Pt has been notified of lab results/recommendations.

## 2019-02-01 NOTE — Telephone Encounter (Signed)
-----   Message from Fay Records, MD sent at 01/30/2019  5:15 PM EST ----- LDL is 90  Still should be lower    Can 1.   Increase prastatin to 40   Or  2 Switch to Crestor 20 mg     F/U lipids in 8 wks

## 2019-03-12 MED FILL — METOPROLOL SUCCINATE ER 50: 50 | 90 days supply | Qty: 90 | Fill #0

## 2019-03-12 MED FILL — PRAVASTATIN NA 40 MG TAB: 40 | 90 days supply | Qty: 90 | Fill #0

## 2019-03-22 MED FILL — CARTIA XT 240 MG CAPSULE SA: 240 | 90 days supply | Qty: 90 | Fill #1

## 2019-03-22 MED FILL — POTASSIUM CHLORIDE CRYS ER: 10 | 90 days supply | Qty: 180 | Fill #0

## 2019-03-22 MED FILL — HYDROCHLOROTHIAZIDE 25 MG T: 25 | 90 days supply | Qty: 90 | Fill #1

## 2019-04-03 MED FILL — METFORMIN HCL ER 500 MG TB2: 500 | 90 days supply | Qty: 90 | Fill #1

## 2019-04-04 ENCOUNTER — Other Ambulatory Visit: Payer: Self-pay

## 2019-04-04 ENCOUNTER — Other Ambulatory Visit: Payer: 59 | Admitting: *Deleted

## 2019-04-04 DIAGNOSIS — E785 Hyperlipidemia, unspecified: Secondary | ICD-10-CM

## 2019-04-04 LAB — LIPID PANEL
Chol/HDL Ratio: 2.9 ratio (ref 0.0–4.4)
Cholesterol, Total: 159 mg/dL (ref 100–199)
HDL: 54 mg/dL (ref >39)
LDL Chol Calc (NIH): 88 mg/dL (ref 0–99)
Triglycerides: 93 mg/dL (ref 0–149)
VLDL Cholesterol Cal: 17 mg/dL (ref 5–40)

## 2019-04-05 ENCOUNTER — Telehealth: Payer: Self-pay | Admitting: *Deleted

## 2019-04-05 DIAGNOSIS — E785 Hyperlipidemia, unspecified: Secondary | ICD-10-CM

## 2019-04-05 MED ORDER — ROSUVASTATIN CALCIUM 20 MG PO TABS: 20.0000 mg | ORAL_TABLET | Freq: Every day | ORAL | 3 refills | Status: AC

## 2019-04-05 NOTE — Telephone Encounter (Signed)
Patient informed of results and aware of recommendations.  She will switch to Crestor 20 mg daily and stop Pravastatin.  Will return for fasting lab work on 06/05/19.

## 2019-04-05 NOTE — Telephone Encounter (Signed)
-----   Message from Dorris Carnes V, MD sent at 04/04/2019  9:56 PM EST ----- Patient should have tighter control of LDL   The pravastatin is not accomplishing this   Would recomm stopping and starting Crestor 20 mg    F/U lipid panel and AST in 8 wks  Goal of LDL in 70s

## 2019-04-23 DIAGNOSIS — H5711 Ocular pain, right eye: Secondary | ICD-10-CM | POA: Diagnosis not present

## 2019-04-23 DIAGNOSIS — I10 Essential (primary) hypertension: Secondary | ICD-10-CM | POA: Diagnosis not present

## 2019-04-23 DIAGNOSIS — E1165 Type 2 diabetes mellitus with hyperglycemia: Secondary | ICD-10-CM | POA: Diagnosis not present

## 2019-04-23 DIAGNOSIS — M7751 Other enthesopathy of right foot: Secondary | ICD-10-CM | POA: Diagnosis not present

## 2019-04-23 DIAGNOSIS — R42 Dizziness and giddiness: Secondary | ICD-10-CM | POA: Diagnosis not present

## 2019-04-24 DIAGNOSIS — R42 Dizziness and giddiness: Secondary | ICD-10-CM | POA: Diagnosis not present

## 2019-04-24 DIAGNOSIS — E1165 Type 2 diabetes mellitus with hyperglycemia: Secondary | ICD-10-CM | POA: Diagnosis not present

## 2019-04-24 DIAGNOSIS — Z7984 Long term (current) use of oral hypoglycemic drugs: Secondary | ICD-10-CM | POA: Diagnosis not present

## 2019-04-24 DIAGNOSIS — E7800 Pure hypercholesterolemia, unspecified: Secondary | ICD-10-CM | POA: Diagnosis not present

## 2019-04-24 DIAGNOSIS — I10 Essential (primary) hypertension: Secondary | ICD-10-CM | POA: Diagnosis not present

## 2019-05-02 MED FILL — ROSUVASTATIN CALCIUM 20 MG: 20 | 90 days supply | Qty: 90 | Fill #0

## 2019-05-05 ENCOUNTER — Ambulatory Visit: Payer: 59 | Attending: Internal Medicine

## 2019-05-05 DIAGNOSIS — Z23 Encounter for immunization: Secondary | ICD-10-CM | POA: Insufficient documentation

## 2019-05-09 DIAGNOSIS — H524 Presbyopia: Secondary | ICD-10-CM | POA: Diagnosis not present

## 2019-05-26 ENCOUNTER — Ambulatory Visit: Payer: 59

## 2019-05-31 MED FILL — METFORMIN HCL ER 500 MG TB2: 500 | 90 days supply | Qty: 180 | Fill #0

## 2019-06-05 ENCOUNTER — Other Ambulatory Visit: Payer: Self-pay

## 2019-06-05 ENCOUNTER — Other Ambulatory Visit: Payer: 59

## 2019-06-05 ENCOUNTER — Other Ambulatory Visit (HOSPITAL_COMMUNITY): Payer: Self-pay | Admitting: Internal Medicine

## 2019-06-05 DIAGNOSIS — E785 Hyperlipidemia, unspecified: Secondary | ICD-10-CM | POA: Diagnosis not present

## 2019-06-05 DIAGNOSIS — E559 Vitamin D deficiency, unspecified: Secondary | ICD-10-CM | POA: Diagnosis not present

## 2019-06-05 DIAGNOSIS — R946 Abnormal results of thyroid function studies: Secondary | ICD-10-CM | POA: Diagnosis not present

## 2019-06-05 DIAGNOSIS — E7800 Pure hypercholesterolemia, unspecified: Secondary | ICD-10-CM | POA: Diagnosis not present

## 2019-06-05 DIAGNOSIS — Z1211 Encounter for screening for malignant neoplasm of colon: Secondary | ICD-10-CM | POA: Diagnosis not present

## 2019-06-05 DIAGNOSIS — Z1231 Encounter for screening mammogram for malignant neoplasm of breast: Secondary | ICD-10-CM | POA: Diagnosis not present

## 2019-06-05 DIAGNOSIS — E049 Nontoxic goiter, unspecified: Secondary | ICD-10-CM | POA: Diagnosis not present

## 2019-06-05 DIAGNOSIS — Z0000 Encounter for general adult medical examination without abnormal findings: Secondary | ICD-10-CM | POA: Diagnosis not present

## 2019-06-05 DIAGNOSIS — E1165 Type 2 diabetes mellitus with hyperglycemia: Secondary | ICD-10-CM | POA: Diagnosis not present

## 2019-06-05 DIAGNOSIS — R7989 Other specified abnormal findings of blood chemistry: Secondary | ICD-10-CM | POA: Diagnosis not present

## 2019-06-05 DIAGNOSIS — I10 Essential (primary) hypertension: Secondary | ICD-10-CM | POA: Diagnosis not present

## 2019-06-05 LAB — LIPID PANEL
Chol/HDL Ratio: 2.6 ratio (ref 0.0–4.4)
Cholesterol, Total: 135 mg/dL (ref 100–199)
HDL: 51 mg/dL (ref >39)
LDL Chol Calc (NIH): 61 mg/dL (ref 0–99)
Triglycerides: 129 mg/dL (ref 0–149)
VLDL Cholesterol Cal: 23 mg/dL (ref 5–40)

## 2019-06-05 LAB — AST: AST: 20 IU/L (ref 0–40)

## 2019-06-05 MED FILL — METOPROLOL SUCCINATE ER 50: 50 | 90 days supply | Qty: 180 | Fill #0

## 2019-06-12 MED FILL — POTASSIUM CHLORIDE CRYS ER: 10 | 90 days supply | Qty: 180 | Fill #1

## 2019-06-12 MED FILL — HYDROCHLOROTHIAZIDE 25 MG T: 25 | 90 days supply | Qty: 90 | Fill #0

## 2019-06-12 MED FILL — CARTIA XT 240 MG CAPSULE SA: 240 | 90 days supply | Qty: 90 | Fill #0

## 2019-06-21 DIAGNOSIS — Z1211 Encounter for screening for malignant neoplasm of colon: Secondary | ICD-10-CM | POA: Diagnosis not present

## 2019-07-16 ENCOUNTER — Other Ambulatory Visit: Payer: Self-pay | Admitting: Surgery

## 2019-07-16 DIAGNOSIS — E042 Nontoxic multinodular goiter: Secondary | ICD-10-CM

## 2019-07-16 DIAGNOSIS — E049 Nontoxic goiter, unspecified: Secondary | ICD-10-CM

## 2019-07-24 ENCOUNTER — Telehealth: Payer: 59 | Admitting: Physician Assistant

## 2019-07-24 DIAGNOSIS — H01001 Unspecified blepharitis right upper eyelid: Secondary | ICD-10-CM | POA: Diagnosis not present

## 2019-07-24 DIAGNOSIS — H5789 Other specified disorders of eye and adnexa: Secondary | ICD-10-CM

## 2019-07-24 MED FILL — TOBRAMYCIN-DEXAMETH OPTH SU: 0.3-0.1 | 13 days supply | Qty: 5 | Fill #0

## 2019-07-24 NOTE — Progress Notes (Signed)
Based on what you shared with me, I feel your condition warrants further evaluation and I recommend that you be seen for a face to face office visit. The swelling of your eye and changes in vision are concerning.  Given your history of diabetes, your risk for an orbital cellulitis is high.  Additionally, since antibiotics have both helped and not helped, it is important that we correctly identify the type of infection to ensure you receive the correct antibiotic.  I recommend a visit with your PCP or urgent care TODAY for further evaluation and to determine the best course of treatment for you.     NOTE: If you entered your credit card information for this eVisit, you will not be charged. You may see a "hold" on your card for the $35 but that hold will drop off and you will not have a charge processed.   If you are having a true medical emergency please call 911.      For an urgent face to face visit, [facility] has five urgent care centers for your convenience:      NEW:  [facility]gent [place] [facility] [place] Get Driving Directions [id number] [address] [place], [place] [address] . 10 am - 6pm Monday - Friday    [place] [facility]gent [facility]) Get Driving Directions [id number] [address] [place], [place] [address] . 10 am to 8 pm Monday-Friday . 12 pm to 8 pm [facility] at [facility] Get Driving Directions [id number] [place], [place], [place] [address] . 8 am to 8 pm Monday-Friday . 9 am to 6 pm Saturday . 11 am to 6 pm Sunday     [facility] at [facility] Get Driving Directions  [id number] [address], [place] [address] . 8 am to 8 pm Monday-Friday . 8 am to 4 pm [facility]gent Care at [place] Get Driving Directions [id number] [address] [place], [place] [address] . 12 pm to 6 pm  Monday-Friday      Your e-visit answers were reviewed by a board certified advanced clinical practitioner to complete your personal care plan.  Thank you for using e-Visits.    Greater than 5 minutes, yet less than 10 minutes of time have been spent researching, coordinating, and implementing care for this patient today

## 2019-07-27 ENCOUNTER — Ambulatory Visit
Admission: RE | Admit: 2019-07-27 | Discharge: 2019-07-27 | Disposition: A | Discharge status: Home / Self Care | Payer: 59 | Source: Ambulatory Visit | Attending: Surgery | Admitting: Surgery

## 2019-07-27 DIAGNOSIS — E042 Nontoxic multinodular goiter: Secondary | ICD-10-CM

## 2019-07-27 DIAGNOSIS — E049 Nontoxic goiter, unspecified: Secondary | ICD-10-CM

## 2019-08-02 ENCOUNTER — Other Ambulatory Visit: Payer: Self-pay

## 2019-08-02 ENCOUNTER — Encounter: Payer: Self-pay | Admitting: Internal Medicine

## 2019-08-02 ENCOUNTER — Ambulatory Visit: Payer: 59 | Admitting: Internal Medicine

## 2019-08-02 VITALS — BP 126/80 | HR 60 | Ht 64.0 in | Wt 201.6 lb

## 2019-08-02 DIAGNOSIS — E782 Mixed hyperlipidemia: Secondary | ICD-10-CM | POA: Diagnosis not present

## 2019-08-02 DIAGNOSIS — I10 Essential (primary) hypertension: Secondary | ICD-10-CM

## 2019-08-02 NOTE — Progress Notes (Signed)
Cardiology Office Note   Date:  08/03/2019   ID:  Cindy Clark, DOB [birth year], MRN [id number]  PCP:  Cindy Cha, MD  Cardiologist:   Dorris Carnes, MD   Pt presents for  follow up of CP and HTN      History of Present Illness: Cindy Clark is a 54 y.o. female with a history of HTN, DM  SHe also has a history of CP    Tightness  Intermitt, not felt to be cardiac   ALoo Hx of DM   Pt does have a hx of tobacco FHx signif for CAD in brother    The pt says she is feeling good  She denies CP  Breathing is OK   Doing some walking    Last Hgb A1C 7   Woking on diet   Current Meds  Medication Sig  . aspirin EC 81 MG tablet Take 1 tablet (81 mg total) by mouth daily.  . Cholecalciferol (VITAMIN D) 50 MCG (2000 UT) tablet Take 2,000 Units by mouth daily.  Marland Kitchen diltiazem (CARTIA XT) 240 MG 24 hr capsule Take 240 mg by mouth daily.  . hydrochlorothiazide (HYDRODIURIL) 25 MG tablet Take 25 mg by mouth daily.  . Loratadine (CLARITIN PO) Take by mouth.  . metFORMIN (GLUCOPHAGE) 500 MG tablet Take 1,000 mg by mouth daily.   . Metoprolol Succinate 50 MG CS24 Take by mouth.  . potassium chloride (KLOR-CON) 20 MEQ packet Take by mouth daily.   . Pseudoephedrine-Ibuprofen (ADVIL COLD/SINUS) 30-200 MG TABS Take 1 tablet by mouth as needed (COLD/SINUS).  . rosuvastatin (CRESTOR) 20 MG tablet Take 1 tablet (20 mg total) by mouth daily.     Allergies:   Wellbutrin [bupropion]   Past Medical History:  Diagnosis Date  . Chest pain   . Elevated blood pressure   . Goiter   . Heart murmur    when pregnant - told was normal  . Hypertension   . Multinodular goiter   . Newly diagnosed diabetes ([place]) 03/04/2017  . Obesity (BMI 30.0-34.9)   . Osteophyte, right hip   . Pure hypercholesterolemia   . Sciatica of right side   . Thyroid disease    reports seen by endocrinologist in the past for thyroid nodules, biopsy and told benign and told nothing further needed  . Thyroid  nodule   . Tobacco use disorder 02/26/2014  . Trigger middle finger of left hand 04/04/2015   Injected under ultrasound 04/04/2015   . Trigger thumb of left hand 04/04/2015   Injected 04/04/2015 Repeat injection 06/09/2015   . Type II diabetes mellitus with complication, uncontrolled ([place])   . Vitamin D deficiency     Past Surgical History:  Procedure Laterality Date  . ECTOPIC PREGNANCY SURGERY    . FACIAL COSMETIC SURGERY    . FACIAL COSMETIC SURGERY       Social History:  The patient  reports that she quit smoking about 6 years ago. Her smoking use included cigarettes. She has a 30.00 pack-year smoking history. She has never used smokeless tobacco. She reports current alcohol use. She reports that she does not use drugs.   Family History:  [name] family history includes Diabetes in her brother; Heart disease (age of onset: 10) in her mother; Hypertension in her brother and mother.    ROS:  Please see the history of present illness. All other systems are reviewed and  Negative to the above problem except as noted.  PHYSICAL EXAM: VS:  BP 126/80   Pulse 60   Ht 5\' 4"  (1.626 m)   Wt 201 lb 9.6 oz (91.4 kg)   SpO2 98%   BMI 34.60 kg/m   GEN: Obese 54 yo , in no acute distress  HEENT: normal  Neck: no JVD, no carotid bruits Cardiac: RRR; no murmurs, rubs, or gallops,no edema  Respiratory:  clear to auscultation bilaterally, normal work of breathing GI: soft, nontender, nondistended, + BS  No hepatomegaly  MS: no deformity Moving all extremities   Skin: warm and dry, no rash Neuro:  Strength and sensation are intact Psych: euthymic mood, full affect   EKG:  EKG is not ordered today.      Lipid Panel    Component Value Date/Time   CHOL 135 06/05/2019 0823   TRIG 129 06/05/2019 0823   HDL 51 06/05/2019 0823   CHOLHDL 2.6 06/05/2019 0823   CHOLHDL 4 05/29/2015 0949   VLDL 27.0 05/29/2015 0949   LDLCALC 61 06/05/2019 0823      Wt Readings from Last 3  Encounters:  08/02/19 201 lb 9.6 oz (91.4 kg)  11/20/18 212 lb 12.8 oz (96.5 kg)  06/09/15 203 lb (92.1 kg)      ASSESSMENT AND PLAN:  1  Chest pain Currently denies   Has had in past   Atypical for cardiac   She has mild plaquing on CT    Need control ofl lipids    2  HTN   Controlled   Follow     3  HL  Lipids in March 2021:  LDL 61; HDL 51  Trig 129   Keep on current regimen  Excellent control  Stay active   Follow up in 10 months    Current medicines are reviewed at length with the patient today.  The patient does not have concerns regarding medicines.  Signed, Dorris Carnes, MD  08/03/2019 9:58 AM    [address], [place], [place]  [address] Phone: [phone]; Fax: [phone]

## 2019-08-02 NOTE — Patient Instructions (Addendum)
Medication Instructions:  Your physician recommends that you continue on your current medications as directed. Please refer to the Current Medication list given to you today.   *If you need a refill on your cardiac medications before your next appointment, please call your pharmacy*   Lab Work: NONE If you have labs (blood work) drawn today and your tests are completely normal, you will receive your results only by: Marland Kitchen MyChart Message (if you have MyChart) OR . A paper copy in the mail If you have any lab test that is abnormal or we need to change your treatment, we will call you to review the results.   Testing/Procedures: NONE   Follow-Up: At [facility], you and your health needs are our priority.  As part of our continuing mission to provide you with exceptional heart care, we have created designated Provider Care Teams.  These Care Teams include your primary Cardiologist (physician) and Advanced Practice Providers (APPs -  Physician Assistants and Nurse Practitioners) who all work together to provide you with the care you need, when you need it.  We recommend signing up for the patient portal called "MyChart".  Sign up information is provided on this After Visit Summary.  MyChart is used to connect with patients for Virtual Visits (Telemedicine).  Patients are able to view lab/test results, encounter notes, upcoming appointments, etc.  Non-urgent messages can be sent to your provider as well.   To learn more about what you can do with MyChart, go to [email or web address].    Your next appointment:   10 month(s)  The format for your next appointment:   In Person  Provider:   Dorris Carnes, MD

## 2019-08-03 ENCOUNTER — Other Ambulatory Visit: Payer: Self-pay | Admitting: Surgery

## 2019-08-03 DIAGNOSIS — E041 Nontoxic single thyroid nodule: Secondary | ICD-10-CM

## 2019-08-16 ENCOUNTER — Other Ambulatory Visit (HOSPITAL_COMMUNITY)
Admission: RE | Admit: 2019-08-16 | Discharge: 2019-08-16 | Disposition: A | Discharge status: Home / Self Care | Payer: 59 | Source: Ambulatory Visit | Attending: Radiology | Admitting: Radiology

## 2019-08-16 ENCOUNTER — Ambulatory Visit
Admission: RE | Admit: 2019-08-16 | Discharge: 2019-08-16 | Disposition: A | Discharge status: Home / Self Care | Payer: 59 | Source: Ambulatory Visit | Attending: Surgery | Admitting: Surgery

## 2019-08-16 DIAGNOSIS — E041 Nontoxic single thyroid nodule: Secondary | ICD-10-CM | POA: Diagnosis not present

## 2019-08-17 LAB — CYTOLOGY - NON PAP

## 2019-08-21 ENCOUNTER — Other Ambulatory Visit: Payer: Self-pay

## 2019-08-21 ENCOUNTER — Emergency Department (HOSPITAL_COMMUNITY): Payer: 59

## 2019-08-21 ENCOUNTER — Telehealth: Payer: Self-pay | Admitting: Internal Medicine

## 2019-08-21 ENCOUNTER — Encounter (HOSPITAL_COMMUNITY): Payer: Self-pay

## 2019-08-21 ENCOUNTER — Emergency Department (HOSPITAL_COMMUNITY)
Admission: EM | Admit: 2019-08-21 | Discharge: 2019-08-21 | Disposition: A | Discharge status: Home / Self Care | Payer: 59 | Attending: Emergency Medicine | Admitting: Emergency Medicine

## 2019-08-21 DIAGNOSIS — Z7984 Long term (current) use of oral hypoglycemic drugs: Secondary | ICD-10-CM | POA: Insufficient documentation

## 2019-08-21 DIAGNOSIS — Z7982 Long term (current) use of aspirin: Secondary | ICD-10-CM | POA: Insufficient documentation

## 2019-08-21 DIAGNOSIS — R079 Chest pain, unspecified: Secondary | ICD-10-CM | POA: Diagnosis not present

## 2019-08-21 DIAGNOSIS — E119 Type 2 diabetes mellitus without complications: Secondary | ICD-10-CM | POA: Insufficient documentation

## 2019-08-21 DIAGNOSIS — Z79899 Other long term (current) drug therapy: Secondary | ICD-10-CM | POA: Insufficient documentation

## 2019-08-21 DIAGNOSIS — R0789 Other chest pain: Secondary | ICD-10-CM | POA: Diagnosis not present

## 2019-08-21 DIAGNOSIS — I10 Essential (primary) hypertension: Secondary | ICD-10-CM | POA: Insufficient documentation

## 2019-08-21 LAB — BASIC METABOLIC PANEL
Anion gap: 10 (ref 5–15)
BUN: 10 mg/dL (ref 6–20)
CO2: 27 mmol/L (ref 22–32)
Calcium: 9.5 mg/dL (ref 8.9–10.3)
Chloride: 103 mmol/L (ref 98–111)
Creatinine, Ser: 0.86 mg/dL (ref 0.44–1.00)
GFR calc Af Amer: >60 mL/min (ref >60)
GFR calc non Af Amer: >60 mL/min (ref >60)
Glucose, Bld: 134 mg/dL — ABNORMAL HIGH (ref 70–99)
Potassium: 3.5 mmol/L (ref 3.5–5.1)
Sodium: 140 mmol/L (ref 135–145)

## 2019-08-21 LAB — CBC
HCT: 40.3 % (ref 36.0–46.0)
Hemoglobin: 13 g/dL (ref 12.0–15.0)
MCH: 30.3 pg (ref 26.0–34.0)
MCHC: 32.3 g/dL (ref 30.0–36.0)
MCV: 93.9 fL (ref 80.0–100.0)
Platelets: 158 10*3/uL (ref 150–400)
RBC: 4.29 MIL/uL (ref 3.87–5.11)
RDW: 13.3 % (ref 11.5–15.5)
WBC: 8.6 10*3/uL (ref 4.0–10.5)
nRBC: 0 % (ref 0.0–0.2)

## 2019-08-21 LAB — TROPONIN I (HIGH SENSITIVITY)
Troponin I (High Sensitivity): 2 ng/L (ref <18)
Troponin I (High Sensitivity): <2 ng/L (ref <18)

## 2019-08-21 LAB — D-DIMER, QUANTITATIVE: D-Dimer, Quant: 0.27 ug/mL-FEU (ref 0.00–0.50)

## 2019-08-21 MED ORDER — SODIUM CHLORIDE 0.9% FLUSH: 3.0000 mL | Freq: Once | INTRAVENOUS | Status: DC

## 2019-08-21 NOTE — Telephone Encounter (Signed)
I spoke to the patient who is having "active" CP.  The pain started at 10:30 this morning.  I advised her to go to the ED for further evaluation.  She verbalized understanding.

## 2019-08-21 NOTE — Telephone Encounter (Signed)
New message      Pt c/o of Chest Pain: STAT if CP now or developed within 24 hours  1. Are you having CP right now? Yes   2. Are you experiencing any other symptoms (ex. SOB, nausea, vomiting, sweating)? No but has tightening in chest   3. How long have you been experiencing CP? Patient states that chest pain started at 10:30 am today  4. Is your CP continuous or coming and going? Coming and going   5. Have you taken Nitroglycerin? No  ?

## 2019-08-21 NOTE — ED Notes (Signed)
Pt states COP worsen when returning from bathroom. States 8/10 sharp. CP alleviated while at rest

## 2019-08-21 NOTE — ED Provider Notes (Signed)
[facility] EMERGENCY DEPARTMENT Provider Note   CSN: [id number] Arrival date & time: 08/21/19  1446     History No chief complaint on file.   Cindy Clark is a 55 y.o. female.  The history is provided by the patient and medical records. No language interpreter was used.   Cindy Clark is a 54 y.o. female who presents to the Emergency Department complaining of chest pain. She presents the emergency department for evaluation of left sided chest pain that began about 1030 this morning. Pain is described as sharp in nature. Episodes wax and wane. The severe component of her episodes only last 20 to 30 seconds but then it eases often becomes a dull discomfort. It is located in the left chest in the left upper back. Pain is worse when she takes a deep breath. She denies any fevers, diaphoresis, nausea, vomiting, shortness of breath, cough, Donald pain, leg swelling or pain. She does have a history of hypertension, hyperlipidemia, diabetes. Symptoms are moderate in nature.   Past Medical History:  Diagnosis Date   Chest pain    Elevated blood pressure    Goiter    Heart murmur    when pregnant - told was normal   Hypertension    Multinodular goiter    Newly diagnosed diabetes ([place]) 03/04/2017   Obesity (BMI 30.0-34.9)    Osteophyte, right hip    Pure hypercholesterolemia    Sciatica of right side    Thyroid disease    reports seen by endocrinologist in the past for thyroid nodules, biopsy and told benign and told nothing further needed   Thyroid nodule    Tobacco use disorder 02/26/2014   Trigger middle finger of left hand 04/04/2015   Injected under ultrasound 04/04/2015    Trigger thumb of left hand 04/04/2015   Injected 04/04/2015 Repeat injection 06/09/2015    Type II diabetes mellitus with complication, uncontrolled ([place])    Vitamin D deficiency     Patient Active Problem List   Diagnosis Date Noted   Newly diagnosed diabetes  ([place]) 03/04/2017   Trigger thumb of left hand 04/04/2015   Trigger middle finger of left hand 04/04/2015   Tobacco use disorder 02/26/2014    Past Surgical History:  Procedure Laterality Date   ECTOPIC PREGNANCY SURGERY     FACIAL COSMETIC SURGERY     FACIAL COSMETIC SURGERY       OB History   No obstetric history on file.     Family History  Problem Relation Age of Onset   Heart disease Mother 56       MI   Hypertension Mother    Diabetes Brother    Hypertension Brother    Breast cancer Neg Hx     Social History   Tobacco Use   Smoking status: Former Smoker    Packs/day: 1.00    Years: 30.00    Pack years: 30.00    Types: Cigarettes    Quit date: 04/20/2013    Years since quitting: 6.3   Smokeless tobacco: Never Used   Tobacco comment: used to be light smoker but ppd since 2012  Substance Use Topics   Alcohol use: Yes    Alcohol/week: 0.0 standard drinks    Comment: OCCASIONALLY   Drug use: No    Home Medications Prior to Admission medications   Medication Sig Start Date End Date Taking? Authorizing Provider  aspirin EC 81 MG tablet Take 1 tablet (81 mg total) by  mouth daily. 11/20/18  Yes Fay Records, MD  Cholecalciferol (VITAMIN D) 50 MCG (2000 UT) tablet Take 2,000 Units by mouth daily.   Yes [provider]  diltiazem (CARTIA XT) 240 MG 24 hr capsule Take 240 mg by mouth daily.   Yes [provider]  hydrochlorothiazide (HYDRODIURIL) 25 MG tablet Take 25 mg by mouth daily.   Yes [provider]  Loratadine (CLARITIN PO) Take 10 mg by mouth daily.    Yes [provider]  metFORMIN (GLUCOPHAGE) 500 MG tablet Take 1,000 mg by mouth daily.    Yes [provider]  Metoprolol Succinate 50 MG CS24 Take 50 mg by mouth daily.    Yes [provider]  potassium chloride (KLOR-CON) 20 MEQ packet Take 20 mEq by mouth daily.    Yes [provider]  rosuvastatin (CRESTOR) 20 MG tablet Take 1  tablet (20 mg total) by mouth daily. 04/05/19  Yes Fay Records, MD    Allergies    Fruit & vegetable daily [nutritional supplements] and Wellbutrin [bupropion]  Review of Systems   Review of Systems  All other systems reviewed and are negative.   Physical Exam Updated Vital Signs BP (!) 107/59    Pulse 61    Temp 98 F (36.7 C) (Oral)    Resp (!) 21    Ht 5\' 5"  (1.651 m)    Wt 90.7 kg    SpO2 96%    BMI 33.28 kg/m   Physical Exam Vitals and nursing note reviewed.  Constitutional:      Appearance: She is well-developed.  HENT:     Head: Normocephalic and atraumatic.  Cardiovascular:     Rate and Rhythm: Normal rate and regular rhythm.     Heart sounds: No murmur.  Pulmonary:     Effort: Pulmonary effort is normal. No respiratory distress.     Breath sounds: Normal breath sounds.  Chest:     Chest wall: No tenderness.  Abdominal:     Palpations: Abdomen is soft.     Tenderness: There is no abdominal tenderness. There is no guarding or rebound.  Musculoskeletal:        General: No swelling or tenderness.  Skin:    General: Skin is warm and dry.  Neurological:     Mental Status: She is alert and oriented to person, place, and time.  Psychiatric:        Behavior: Behavior normal.     ED Results / Procedures / Treatments   Labs (all labs ordered are listed, but only abnormal results are displayed) Labs Reviewed  BASIC METABOLIC PANEL - Abnormal; Notable for the following components:      Result Value   Glucose, Bld 134 (*)    All other components within normal limits  CBC  D-DIMER, QUANTITATIVE (NOT AT [facility])  TROPONIN I (HIGH SENSITIVITY)  TROPONIN I (HIGH SENSITIVITY)    EKG EKG Interpretation  Date/Time:  Tuesday August 21 2019 18:22:59 EDT Ventricular Rate:  54 PR Interval:  148 QRS Duration: 78 QT Interval:  445 QTC Calculation: 422 R Axis:   42 Text Interpretation: Sinus rhythm Atrial premature complex Abnormal R-wave progression, early transition  Borderline T abnormalities, anterior leads Confirmed by Quintella Reichert [phone]) on 08/21/2019 6:44:16 PM   Radiology DG Chest 2 View  Result Date: 08/21/2019 CLINICAL DATA:  Chest pain. Additional provided: Patient reports left anterior chest pain intermittently since 10:30 a.m., radiation to left scapula. EXAM: CHEST - 2 VIEW  COMPARISON:  CT chest 11/06/2018, chest radiograph 05/10/2015 FINDINGS: Heart size within normal limits. There is no appreciable airspace consolidation. No evidence of pleural effusion or pneumothorax. No acute bony abnormality identified. IMPRESSION: No evidence of acute cardiopulmonary abnormality. Electronically Signed   By: Kellie Simmering DO   On: 08/21/2019 15:45    Procedures Procedures (including critical care time)  Medications Ordered in ED Medications  sodium chloride flush (NS) 0.9 % injection 3 mL (3 mLs Intravenous Not Given 08/21/19 1917)    ED Course  I have reviewed the triage vital signs and the nursing notes.  Pertinent labs & imaging results that were available during my care of the patient were reviewed by me and considered in my medical decision making (see chart for details).    MDM Rules/Calculators/A&P                     Patient here for evaluation of waxing and waning left-sided chest pain, episodes are very brief. EKG without acute ischemic changes in troponin is negative times two. Presentation is not consistent with ACS, dissection. D dimer is negative, doubt PE. Discussed with patient home care for atypical chest pain, possible musculoskeletal pain. Discussed outpatient follow-up and return precautions.  Final Clinical Impression(s) / ED Diagnoses Final diagnoses:  Atypical chest pain    Rx / DC Orders ED Discharge Orders    None       Quintella Reichert, MD 08/22/19 0020

## 2019-08-21 NOTE — ED Notes (Signed)
Discharge instructions discussed with pt. Pt verbalized understanding with no questions at this time. Pt to go home with family at bedside 

## 2019-08-21 NOTE — ED Notes (Signed)
Pt refused IV start 

## 2019-08-21 NOTE — ED Triage Notes (Signed)
Patient complains of left anterior CP that she describes as intermittent since 1030am, complains of radiation to left scapula. Patient alert and oriented, NAD

## 2019-08-23 DIAGNOSIS — E042 Nontoxic multinodular goiter: Secondary | ICD-10-CM | POA: Diagnosis not present

## 2019-09-07 MED FILL — POTASSIUM CHLORIDE CRYS ER: 10 | 90 days supply | Qty: 180 | Fill #0

## 2019-09-07 MED FILL — HYDROCHLOROTHIAZIDE 25 MG T: 25 | 90 days supply | Qty: 90 | Fill #1

## 2019-09-19 MED FILL — CARTIA XT 240 MG CAPSULE SA: 240 | 90 days supply | Qty: 90 | Fill #1

## 2019-11-14 MED FILL — ROSUVASTATIN CALCIUM 20 MG: 20 | 90 days supply | Qty: 90 | Fill #2

## 2019-11-27 MED FILL — METOPROLOL SUCCINATE ER 50: 50 | 90 days supply | Qty: 180 | Fill #1

## 2019-11-27 MED FILL — METFORMIN HCL ER 500 MG TB2: 500 | 90 days supply | Qty: 180 | Fill #0

## 2019-12-18 MED FILL — CARTIA XT 240 MG CAPSULE SA: 240 | 90 days supply | Qty: 90 | Fill #2

## 2019-12-18 MED FILL — HYDROCHLOROTHIAZIDE 25 MG T: 25 | 90 days supply | Qty: 90 | Fill #0

## 2019-12-18 MED FILL — POTASSIUM CHLORIDE CRYS ER: 10 | 90 days supply | Qty: 180 | Fill #1

## 2020-01-01 DIAGNOSIS — E7800 Pure hypercholesterolemia, unspecified: Secondary | ICD-10-CM | POA: Diagnosis not present

## 2020-01-01 DIAGNOSIS — I10 Essential (primary) hypertension: Secondary | ICD-10-CM | POA: Diagnosis not present

## 2020-01-01 DIAGNOSIS — E1165 Type 2 diabetes mellitus with hyperglycemia: Secondary | ICD-10-CM | POA: Diagnosis not present

## 2020-01-01 DIAGNOSIS — Z1231 Encounter for screening mammogram for malignant neoplasm of breast: Secondary | ICD-10-CM | POA: Diagnosis not present

## 2020-02-11 MED FILL — ROSUVASTATIN CALCIUM 20 MG: 20 | 60 days supply | Qty: 60 | Fill #3

## 2020-02-21 MED FILL — METFORMIN HCL ER 500 MG TB2: 500 | 90 days supply | Qty: 180 | Fill #1

## 2020-03-19 ENCOUNTER — Other Ambulatory Visit (HOSPITAL_COMMUNITY): Payer: Self-pay | Admitting: Internal Medicine

## 2020-03-19 MED FILL — CARTIA XT 240 MG CAPSULE SA: 240 | 90 days supply | Qty: 90 | Fill #0

## 2020-03-19 MED FILL — POTASSIUM CHLORIDE CRYS ER: 10 | 90 days supply | Qty: 180 | Fill #2

## 2020-03-19 MED FILL — HYDROCHLOROTHIAZIDE 25 MG T: 25 | 90 days supply | Qty: 90 | Fill #1

## 2020-03-26 DIAGNOSIS — Z01419 Encounter for gynecological examination (general) (routine) without abnormal findings: Secondary | ICD-10-CM | POA: Diagnosis not present

## 2020-04-02 ENCOUNTER — Other Ambulatory Visit: Payer: Self-pay | Admitting: Internal Medicine

## 2020-04-02 ENCOUNTER — Other Ambulatory Visit (HOSPITAL_COMMUNITY): Payer: Self-pay | Admitting: Internal Medicine

## 2020-04-02 ENCOUNTER — Ambulatory Visit
Admission: RE | Admit: 2020-04-02 | Discharge: 2020-04-02 | Disposition: A | Discharge status: Home / Self Care | Payer: 59 | Source: Ambulatory Visit | Attending: Internal Medicine | Admitting: Internal Medicine

## 2020-04-02 DIAGNOSIS — M5412 Radiculopathy, cervical region: Secondary | ICD-10-CM | POA: Diagnosis not present

## 2020-04-02 DIAGNOSIS — Z7984 Long term (current) use of oral hypoglycemic drugs: Secondary | ICD-10-CM | POA: Diagnosis not present

## 2020-04-02 DIAGNOSIS — M47812 Spondylosis without myelopathy or radiculopathy, cervical region: Secondary | ICD-10-CM | POA: Diagnosis not present

## 2020-04-02 DIAGNOSIS — I10 Essential (primary) hypertension: Secondary | ICD-10-CM | POA: Diagnosis not present

## 2020-04-02 DIAGNOSIS — E1169 Type 2 diabetes mellitus with other specified complication: Secondary | ICD-10-CM | POA: Diagnosis not present

## 2020-04-02 MED FILL — predniSONE 10 MG TABS: 10 | 4 days supply | Qty: 10 | Fill #0

## 2020-04-02 MED FILL — ROSUVASTATIN CALCIUM 20 MG: 20 | 90 days supply | Qty: 90 | Fill #0

## 2020-05-10 DIAGNOSIS — H524 Presbyopia: Secondary | ICD-10-CM | POA: Diagnosis not present

## 2020-05-21 MED FILL — METFORMIN HCL ER 500 MG TB2: 500 | 90 days supply | Qty: 180 | Fill #2

## 2020-05-21 MED FILL — METOPROLOL SUCCINATE ER 50: 50 | 90 days supply | Qty: 180 | Fill #2

## 2020-06-11 MED FILL — CARTIA XT 240 MG CAPSULE SA: 240 | 90 days supply | Qty: 90 | Fill #1

## 2020-06-12 ENCOUNTER — Other Ambulatory Visit: Payer: Self-pay | Admitting: Internal Medicine

## 2020-06-12 DIAGNOSIS — Z1231 Encounter for screening mammogram for malignant neoplasm of breast: Secondary | ICD-10-CM

## 2020-06-17 ENCOUNTER — Ambulatory Visit
Admission: RE | Admit: 2020-06-17 | Discharge: 2020-06-17 | Disposition: A | Discharge status: Home / Self Care | Payer: 59 | Source: Ambulatory Visit | Attending: Internal Medicine | Admitting: Internal Medicine

## 2020-06-17 ENCOUNTER — Other Ambulatory Visit: Payer: Self-pay

## 2020-06-17 DIAGNOSIS — Z1231 Encounter for screening mammogram for malignant neoplasm of breast: Secondary | ICD-10-CM | POA: Diagnosis not present

## 2020-06-18 ENCOUNTER — Other Ambulatory Visit (HOSPITAL_COMMUNITY): Payer: Self-pay | Admitting: Internal Medicine

## 2020-06-27 ENCOUNTER — Other Ambulatory Visit (HOSPITAL_COMMUNITY): Payer: Self-pay

## 2020-06-27 MED FILL — Hydrochlorothiazide Tab 25 MG: ORAL | 90 days supply | Qty: 90 | Fill #0 | Status: CN

## 2020-06-27 MED FILL — Hydrochlorothiazide Tab 25 MG: ORAL | 90 days supply | Qty: 90 | Fill #0 | Status: AC

## 2020-06-27 MED FILL — Potassium Chloride Tab ER 10 mEq: ORAL | 90 days supply | Qty: 180 | Fill #0 | Status: CN

## 2020-06-27 MED FILL — Potassium Chloride Tab ER 10 mEq: ORAL | 90 days supply | Qty: 180 | Fill #0 | Status: AC

## 2020-06-28 ENCOUNTER — Other Ambulatory Visit (HOSPITAL_COMMUNITY): Payer: Self-pay

## 2020-07-02 ENCOUNTER — Other Ambulatory Visit (HOSPITAL_COMMUNITY): Payer: Self-pay

## 2020-07-02 MED ORDER — ROSUVASTATIN CALCIUM 20 MG PO TABS
20.0000 mg | ORAL_TABLET | Freq: Every day | ORAL | 3 refills | Status: AC
  Filled 2020-07-02: qty 90, 90d supply, fill #0
  Filled 2020-10-17: qty 90, 90d supply, fill #1

## 2020-07-02 MED ORDER — TOBRAMYCIN-DEXAMETHASONE 0.3-0.1 % OP SUSP
1.0000 [drp] | Freq: Four times a day (QID) | OPHTHALMIC | 0 refills | Status: AC
  Filled 2020-07-02: qty 5, 13d supply, fill #0

## 2020-07-03 ENCOUNTER — Other Ambulatory Visit (HOSPITAL_COMMUNITY): Payer: Self-pay

## 2020-07-28 ENCOUNTER — Other Ambulatory Visit (HOSPITAL_COMMUNITY): Payer: Self-pay

## 2020-07-28 DIAGNOSIS — M5431 Sciatica, right side: Secondary | ICD-10-CM | POA: Diagnosis not present

## 2020-07-28 DIAGNOSIS — E7800 Pure hypercholesterolemia, unspecified: Secondary | ICD-10-CM | POA: Diagnosis not present

## 2020-07-28 DIAGNOSIS — E559 Vitamin D deficiency, unspecified: Secondary | ICD-10-CM | POA: Diagnosis not present

## 2020-07-28 DIAGNOSIS — I10 Essential (primary) hypertension: Secondary | ICD-10-CM | POA: Diagnosis not present

## 2020-07-28 DIAGNOSIS — Z0000 Encounter for general adult medical examination without abnormal findings: Secondary | ICD-10-CM | POA: Diagnosis not present

## 2020-07-28 DIAGNOSIS — E042 Nontoxic multinodular goiter: Secondary | ICD-10-CM | POA: Diagnosis not present

## 2020-07-28 DIAGNOSIS — Z720 Tobacco use: Secondary | ICD-10-CM | POA: Diagnosis not present

## 2020-07-28 DIAGNOSIS — E1165 Type 2 diabetes mellitus with hyperglycemia: Secondary | ICD-10-CM | POA: Diagnosis not present

## 2020-07-28 DIAGNOSIS — E669 Obesity, unspecified: Secondary | ICD-10-CM | POA: Diagnosis not present

## 2020-07-28 MED ORDER — POTASSIUM CHLORIDE CRYS ER 10 MEQ PO TBCR
20.0000 meq | EXTENDED_RELEASE_TABLET | Freq: Every day | ORAL | 4 refills | Status: DC
  Filled 2020-07-28: qty 90, 45d supply, fill #0
  Filled 2020-11-07: qty 90, 45d supply, fill #1
  Filled 2020-12-17 – 2020-12-21 (×2): qty 90, 45d supply, fill #2
  Filled 2021-02-18: qty 90, 45d supply, fill #3
  Filled 2021-04-03: qty 90, 45d supply, fill #4

## 2020-07-28 MED ORDER — METOPROLOL SUCCINATE ER 50 MG PO TB24
100.0000 mg | ORAL_TABLET | Freq: Every day | ORAL | 3 refills | Status: DC
  Filled 2020-07-28 – 2020-11-20 (×2): qty 180, 90d supply, fill #0
  Filled 2021-05-15: qty 180, 90d supply, fill #1

## 2020-07-28 MED ORDER — ROSUVASTATIN CALCIUM 20 MG PO TABS
20.0000 mg | ORAL_TABLET | Freq: Every day | ORAL | 3 refills | Status: DC
  Filled 2020-07-28 – 2021-01-11 (×2): qty 90, 90d supply, fill #0
  Filled 2021-04-14: qty 90, 90d supply, fill #1
  Filled 2021-07-13: qty 90, 90d supply, fill #2

## 2020-07-28 MED ORDER — DILTIAZEM HCL ER COATED BEADS 240 MG PO CP24
240.0000 mg | ORAL_CAPSULE | Freq: Every day | ORAL | 3 refills | Status: DC
  Filled 2020-07-28 – 2020-11-20 (×2): qty 90, 90d supply, fill #0
  Filled 2021-03-08: qty 90, 90d supply, fill #1
  Filled 2021-06-13: qty 90, 90d supply, fill #2

## 2020-07-28 MED ORDER — HYDROCHLOROTHIAZIDE 25 MG PO TABS
25.0000 mg | ORAL_TABLET | Freq: Every morning | ORAL | 3 refills | Status: AC
  Filled 2020-07-28 – 2020-12-21 (×3): qty 90, 90d supply, fill #0
  Filled 2021-03-18: qty 90, 90d supply, fill #1
  Filled 2021-07-05: qty 90, 90d supply, fill #2

## 2020-08-01 ENCOUNTER — Other Ambulatory Visit (HOSPITAL_COMMUNITY): Payer: Self-pay

## 2020-08-04 ENCOUNTER — Other Ambulatory Visit (HOSPITAL_COMMUNITY): Payer: Self-pay

## 2020-08-04 MED ORDER — METFORMIN HCL ER 500 MG PO TB24
2000.0000 mg | ORAL_TABLET | Freq: Every day | ORAL | 5 refills | Status: DC
Start: 2020-07-30 — End: 2021-01-05
  Filled 2020-08-04: qty 120, 30d supply, fill #0
  Filled 2020-09-08: qty 120, 30d supply, fill #1
  Filled 2020-10-08: qty 120, 30d supply, fill #2
  Filled 2020-11-07: qty 120, 30d supply, fill #3

## 2020-08-19 DIAGNOSIS — Z1212 Encounter for screening for malignant neoplasm of rectum: Secondary | ICD-10-CM | POA: Diagnosis not present

## 2020-08-19 DIAGNOSIS — Z1211 Encounter for screening for malignant neoplasm of colon: Secondary | ICD-10-CM | POA: Diagnosis not present

## 2020-09-03 ENCOUNTER — Other Ambulatory Visit: Payer: Self-pay | Admitting: Surgery

## 2020-09-03 DIAGNOSIS — E042 Nontoxic multinodular goiter: Secondary | ICD-10-CM

## 2020-09-04 DIAGNOSIS — E042 Nontoxic multinodular goiter: Secondary | ICD-10-CM | POA: Diagnosis not present

## 2020-09-08 ENCOUNTER — Other Ambulatory Visit (HOSPITAL_COMMUNITY): Payer: Self-pay

## 2020-09-08 MED FILL — Diltiazem HCl Coated Beads Cap ER 24HR 240 MG: ORAL | 90 days supply | Qty: 90 | Fill #0 | Status: AC

## 2020-09-08 MED FILL — Hydrochlorothiazide Tab 25 MG: ORAL | 90 days supply | Qty: 90 | Fill #1 | Status: AC

## 2020-09-24 ENCOUNTER — Ambulatory Visit
Admission: RE | Admit: 2020-09-24 | Discharge: 2020-09-24 | Disposition: A | Discharge status: Home / Self Care | Payer: 59 | Source: Ambulatory Visit | Attending: Surgery | Admitting: Surgery

## 2020-09-24 DIAGNOSIS — E042 Nontoxic multinodular goiter: Secondary | ICD-10-CM

## 2020-09-24 DIAGNOSIS — E041 Nontoxic single thyroid nodule: Secondary | ICD-10-CM | POA: Diagnosis not present

## 2020-10-08 ENCOUNTER — Other Ambulatory Visit (HOSPITAL_COMMUNITY): Payer: Self-pay

## 2020-10-17 ENCOUNTER — Other Ambulatory Visit (HOSPITAL_COMMUNITY): Payer: Self-pay

## 2020-11-07 ENCOUNTER — Other Ambulatory Visit (HOSPITAL_COMMUNITY): Payer: Self-pay

## 2020-11-20 ENCOUNTER — Other Ambulatory Visit (HOSPITAL_COMMUNITY): Payer: Self-pay

## 2020-11-25 ENCOUNTER — Other Ambulatory Visit (HOSPITAL_COMMUNITY): Payer: Self-pay

## 2020-11-27 ENCOUNTER — Other Ambulatory Visit (HOSPITAL_COMMUNITY): Payer: Self-pay

## 2020-11-27 DIAGNOSIS — Z7984 Long term (current) use of oral hypoglycemic drugs: Secondary | ICD-10-CM | POA: Diagnosis not present

## 2020-11-27 DIAGNOSIS — Z720 Tobacco use: Secondary | ICD-10-CM | POA: Diagnosis not present

## 2020-11-27 DIAGNOSIS — E1165 Type 2 diabetes mellitus with hyperglycemia: Secondary | ICD-10-CM | POA: Diagnosis not present

## 2020-11-27 MED ORDER — METFORMIN HCL ER 500 MG PO TB24
1000.0000 mg | ORAL_TABLET | Freq: Every day | ORAL | 5 refills | Status: AC
  Filled 2020-11-27 – 2020-12-21 (×3): qty 60, 30d supply, fill #0
  Filled 2021-01-18: qty 60, 30d supply, fill #1
  Filled 2021-02-18: qty 60, 30d supply, fill #2
  Filled 2021-03-18: qty 60, 30d supply, fill #3
  Filled 2021-04-19: qty 60, 30d supply, fill #4
  Filled 2021-05-23: qty 60, 30d supply, fill #5

## 2020-11-27 MED ORDER — JARDIANCE 10 MG PO TABS
10.0000 mg | ORAL_TABLET | Freq: Every day | ORAL | 5 refills | Status: DC
  Filled 2020-11-27: qty 90, 90d supply, fill #0
  Filled 2021-02-18: qty 90, 90d supply, fill #1

## 2020-12-17 ENCOUNTER — Other Ambulatory Visit: Payer: Self-pay

## 2020-12-22 ENCOUNTER — Other Ambulatory Visit (HOSPITAL_COMMUNITY): Payer: Self-pay

## 2021-01-04 NOTE — Progress Notes (Signed)
Cardiology Office Note   Date:  01/05/2021   ID:  Kimbrely Buckel, DOB 05-28-1965, MRN [id number]  PCP:  Leeroy Cha, MD  Cardiologist:   Dorris Carnes, MD   Pt presents for  follow up of CP and HTN      History of Present Illness: Cindy Clark is a 55 y.o. female with a history of HTN, DM  SHe also has a history of CP    Tightness  Intermitt, not felt to be cardiac   ALoo Hx of DM   Pt does have a hx of tobacco FHx signif for CAD in brother    I last saw the pt in clinic in May 2021  Patient says she gets rare CP   Not associated with activity   Breathing is OK      Diet:    Admits she needs to eat more salad   Does eat veggies    Br:  Oatmeal or boiled egg   Occasional bacon   Sausage  [place] 1 cup coffee   Water     Nonsugar sunkist Lunch:   Cafeteria   Left overs     Crackers   Chicken Dinner   Same as lunch    Snacks:   CHips   Fruit   APple Grape Peaches    Fig newton    Current Meds  Medication Sig   aspirin EC 81 MG tablet Take 1 tablet (81 mg total) by mouth daily.   Cholecalciferol (VITAMIN D) 50 MCG (2000 UT) tablet Take 2,000 Units by mouth daily.   diltiazem (CARDIZEM CD) 240 MG 24 hr capsule Take 240 mg by mouth daily.   diltiazem (CARDIZEM CD) 240 MG 24 hr capsule Take 1 capsule by mouth once daily   diltiazem (CARDIZEM CD) 240 MG 24 hr capsule TAKE 1 CAPSULE BY MOUTH ONCE A DAY   empagliflozin (JARDIANCE) 10 MG TABS tablet Take 1 tablet (10 mg total) by mouth daily.   hydrochlorothiazide (HYDRODIURIL) 25 MG tablet Take 25 mg by mouth daily.   hydrochlorothiazide (HYDRODIURIL) 25 MG tablet Take 1 tablet (25 mg total) by mouth in the morning.   hydrochlorothiazide (HYDRODIURIL) 25 MG tablet TAKE 1 TABLET BY MOUTH ONCE DAILY IN THE MORNING.   Loratadine (CLARITIN PO) Take 10 mg by mouth daily.    metFORMIN (GLUCOPHAGE) 500 MG tablet Take 1,000 mg by mouth daily.    metFORMIN (GLUCOPHAGE-XR) 500 MG 24 hr tablet Take 2 tablets (1,000 mg  total) by mouth daily with supper.   metoprolol succinate (TOPROL-XL) 50 MG 24 hr tablet Take 2 tablets (100 mg total) by mouth daily.   Metoprolol Succinate 50 MG CS24 Take 50 mg by mouth daily.    potassium chloride (KLOR-CON) 10 MEQ tablet Take 2 tablets by mouth once a day with hydrochlorothiazide.   potassium chloride (KLOR-CON) 10 MEQ tablet TAKE 2 TABLETS BY MOUTH ONCE A DAY WITH HYDROCHLOROTHIAZIDE.   potassium chloride (KLOR-CON) 20 MEQ packet Take 20 mEq by mouth daily.    predniSONE (DELTASONE) 10 MG tablet TAKE 4 TABLETS BY MOUTH FOR 1 DAY, 3 TABLETS FOR 1 DAY, 2 TABLETS FOR 1 DAY , THEN 1 TABLET FOR 1 DAY   rosuvastatin (CRESTOR) 20 MG tablet Take 1 tablet (20 mg total) by mouth daily.   rosuvastatin (CRESTOR) 20 MG tablet Take 1 tablet (20 mg total) by mouth daily.   rosuvastatin (CRESTOR) 20 MG tablet Take 1 tablet (20 mg total) by mouth daily.  tobramycin-dexamethasone (TOBRADEX) ophthalmic solution Instill 1 drop into affected eye every 6 hours for 7 days     Allergies:   Fruit & vegetable daily [nutritional supplements] and Wellbutrin [bupropion]   Past Medical History:  Diagnosis Date   Chest pain    Elevated blood pressure    Goiter    Heart murmur    when pregnant - told was normal   Hypertension    Multinodular goiter    Newly diagnosed diabetes ([place]) 03/04/2017   Obesity (BMI 30.0-34.9)    Osteophyte, right hip    Pure hypercholesterolemia    Sciatica of right side    Thyroid disease    reports seen by endocrinologist in the past for thyroid nodules, biopsy and told benign and told nothing further needed   Thyroid nodule    Tobacco use disorder 02/26/2014   Trigger middle finger of left hand 04/04/2015   Injected under ultrasound 04/04/2015    Trigger thumb of left hand 04/04/2015   Injected 04/04/2015 Repeat injection 06/09/2015    Type II diabetes mellitus with complication, uncontrolled    Vitamin D deficiency     Past Surgical History:  Procedure  Laterality Date   ECTOPIC PREGNANCY SURGERY     FACIAL COSMETIC SURGERY     FACIAL COSMETIC SURGERY       Social History:  The patient  reports that she quit smoking about 7 years ago. Her smoking use included cigarettes. She has a 30.00 pack-year smoking history. She has never used smokeless tobacco. She reports current alcohol use. She reports that she does not use drugs.   Family History:  [name] family history includes Breast cancer in her daughter; Diabetes in her brother; Heart disease (age of onset: 87) in her mother; Hypertension in her brother and mother.    ROS:  Please see the history of present illness. All other systems are reviewed and  Negative to the above problem except as noted.    PHYSICAL EXAM: VS:  BP 128/68   Pulse (!) 56   Ht 5\' 5"  (1.651 m)   Wt 201 lb 6.4 oz (91.4 kg)   SpO2 96%   BMI 33.51 kg/m   GEN: Obese 55 yo , in no acute distress  HEENT: normal  Neck: no JVD, no carotid bruits Cardiac: RRR; no murmurs, rubs, or gallops,no edema  Respiratory:  clear to auscultation bilaterally, normal work of breathing GI: soft, nontender, nondistended, + BS  No hepatomegaly  MS: no deformity Moving all extremities   Skin: warm and dry, no rash Neuro:  Strength and sensation are intact Psych: euthymic mood, full affect   EKG:  EKG is not ordered today.   SB  56 bpm  Nonspecific ST changes      Lipid Panel    Component Value Date/Time   CHOL 135 06/05/2019 0823   TRIG 129 06/05/2019 0823   HDL 51 06/05/2019 0823   CHOLHDL 2.6 06/05/2019 0823   CHOLHDL 4 05/29/2015 0949   VLDL 27.0 05/29/2015 0949   LDLCALC 61 06/05/2019 0823      Wt Readings from Last 3 Encounters:  01/05/21 201 lb 6.4 oz (91.4 kg)  08/21/19 200 lb (90.7 kg)  08/02/19 201 lb 9.6 oz (91.4 kg)      ASSESSMENT AND PLAN:  1  Chest pain Currently denies   Has had in past   Atypical for cardiac   She has mild plaquing on CT    Need control ofl lipids  2  HTN   Controlled    Follow     3  HL  Lipids in March 2021:  LDL 47   HDL 57    Total 140    Stay active   Follow up in 10 months    Current medicines are reviewed at length with the patient today.  The patient does not have concerns regarding medicines.  Signed, Dorris Carnes, MD  01/05/2021 8:45 AM    [facility] [address], [place], [place]  [address] Phone: [phone]; Fax: [phone]

## 2021-01-05 ENCOUNTER — Other Ambulatory Visit: Payer: Self-pay

## 2021-01-05 ENCOUNTER — Encounter: Payer: Self-pay | Admitting: Internal Medicine

## 2021-01-05 ENCOUNTER — Ambulatory Visit: Payer: 59 | Admitting: Internal Medicine

## 2021-01-05 VITALS — BP 128/68 | HR 56 | Ht 65.0 in | Wt 201.4 lb

## 2021-01-05 DIAGNOSIS — I10 Essential (primary) hypertension: Secondary | ICD-10-CM

## 2021-01-05 NOTE — Patient Instructions (Signed)
Medication Instructions:  Your physician recommends that you continue on your current medications as directed. Please refer to the Current Medication list given to you today.  *If you need a refill on your cardiac medications before your next appointment, please call your pharmacy*   Lab Work: none If you have labs (blood work) drawn today and your tests are completely normal, you will receive your results only by: MyChart Message (if you have MyChart) OR A paper copy in the mail If you have any lab test that is abnormal or we need to change your treatment, we will call you to review the results.   Testing/Procedures: none   Follow-Up: At [facility], you and your health needs are our priority.  As part of our continuing mission to provide you with exceptional heart care, we have created designated Provider Care Teams.  These Care Teams include your primary Cardiologist (physician) and Advanced Practice Providers (APPs -  Physician Assistants and Nurse Practitioners) who all work together to provide you with the care you need, when you need it.  We recommend signing up for the patient portal called "MyChart".  Sign up information is provided on this After Visit Summary.  MyChart is used to connect with patients for Virtual Visits (Telemedicine).  Patients are able to view lab/test results, encounter notes, upcoming appointments, etc.  Non-urgent messages can be sent to your provider as well.   To learn more about what you can do with MyChart, go to [email or web address].    Your next appointment:   1 year(s)  The format for your next appointment:   In Person  Provider:   Paula Ross, MD     Other Instructions   

## 2021-01-12 ENCOUNTER — Other Ambulatory Visit (HOSPITAL_COMMUNITY): Payer: Self-pay

## 2021-01-19 ENCOUNTER — Other Ambulatory Visit (HOSPITAL_COMMUNITY): Payer: Self-pay

## 2021-02-19 ENCOUNTER — Other Ambulatory Visit (HOSPITAL_COMMUNITY): Payer: Self-pay

## 2021-02-24 DIAGNOSIS — Z7984 Long term (current) use of oral hypoglycemic drugs: Secondary | ICD-10-CM | POA: Diagnosis not present

## 2021-02-24 DIAGNOSIS — E1165 Type 2 diabetes mellitus with hyperglycemia: Secondary | ICD-10-CM | POA: Diagnosis not present

## 2021-02-24 DIAGNOSIS — I10 Essential (primary) hypertension: Secondary | ICD-10-CM | POA: Diagnosis not present

## 2021-03-09 ENCOUNTER — Other Ambulatory Visit (HOSPITAL_COMMUNITY): Payer: Self-pay

## 2021-03-19 ENCOUNTER — Other Ambulatory Visit (HOSPITAL_COMMUNITY): Payer: Self-pay

## 2021-04-03 ENCOUNTER — Other Ambulatory Visit (HOSPITAL_COMMUNITY): Payer: Self-pay

## 2021-04-14 ENCOUNTER — Other Ambulatory Visit (HOSPITAL_COMMUNITY): Payer: Self-pay

## 2021-04-15 ENCOUNTER — Other Ambulatory Visit (HOSPITAL_COMMUNITY): Payer: Self-pay

## 2021-04-20 ENCOUNTER — Other Ambulatory Visit (HOSPITAL_COMMUNITY): Payer: Self-pay

## 2021-05-09 DIAGNOSIS — H524 Presbyopia: Secondary | ICD-10-CM | POA: Diagnosis not present

## 2021-05-13 ENCOUNTER — Other Ambulatory Visit (HOSPITAL_COMMUNITY): Payer: Self-pay

## 2021-05-13 DIAGNOSIS — E042 Nontoxic multinodular goiter: Secondary | ICD-10-CM | POA: Diagnosis not present

## 2021-05-13 DIAGNOSIS — G8929 Other chronic pain: Secondary | ICD-10-CM | POA: Diagnosis not present

## 2021-05-13 DIAGNOSIS — R9389 Abnormal findings on diagnostic imaging of other specified body structures: Secondary | ICD-10-CM | POA: Diagnosis not present

## 2021-05-13 DIAGNOSIS — M25512 Pain in left shoulder: Secondary | ICD-10-CM | POA: Diagnosis not present

## 2021-05-13 DIAGNOSIS — Z87891 Personal history of nicotine dependence: Secondary | ICD-10-CM | POA: Diagnosis not present

## 2021-05-13 DIAGNOSIS — J439 Emphysema, unspecified: Secondary | ICD-10-CM | POA: Diagnosis not present

## 2021-05-13 MED ORDER — TIZANIDINE HCL 4 MG PO TABS
4.0000 mg | ORAL_TABLET | Freq: Three times a day (TID) | ORAL | 0 refills | Status: AC
  Filled 2021-05-13: qty 30, 10d supply, fill #0

## 2021-05-14 ENCOUNTER — Other Ambulatory Visit: Payer: Self-pay | Admitting: Internal Medicine

## 2021-05-14 DIAGNOSIS — R9389 Abnormal findings on diagnostic imaging of other specified body structures: Secondary | ICD-10-CM

## 2021-05-15 ENCOUNTER — Other Ambulatory Visit (HOSPITAL_COMMUNITY): Payer: Self-pay

## 2021-05-15 MED ORDER — JARDIANCE 10 MG PO TABS
10.0000 mg | ORAL_TABLET | Freq: Every day | ORAL | 5 refills | Status: DC
  Filled 2021-05-15: qty 30, 30d supply, fill #0
  Filled 2021-06-13: qty 30, 30d supply, fill #1
  Filled 2021-07-22: qty 30, 30d supply, fill #2
  Filled 2021-08-19: qty 30, 30d supply, fill #3
  Filled 2021-09-18: qty 30, 30d supply, fill #4
  Filled 2021-10-18: qty 30, 30d supply, fill #5

## 2021-05-24 ENCOUNTER — Other Ambulatory Visit (HOSPITAL_COMMUNITY): Payer: Self-pay

## 2021-05-25 ENCOUNTER — Other Ambulatory Visit (HOSPITAL_COMMUNITY): Payer: Self-pay

## 2021-05-30 ENCOUNTER — Other Ambulatory Visit (HOSPITAL_COMMUNITY): Payer: Self-pay

## 2021-06-01 ENCOUNTER — Other Ambulatory Visit (HOSPITAL_COMMUNITY): Payer: Self-pay

## 2021-06-01 MED ORDER — POTASSIUM CHLORIDE CRYS ER 10 MEQ PO TBCR
EXTENDED_RELEASE_TABLET | ORAL | 0 refills | Status: DC
  Filled 2021-06-01: qty 180, 90d supply, fill #0

## 2021-06-13 ENCOUNTER — Other Ambulatory Visit (HOSPITAL_COMMUNITY): Payer: Self-pay

## 2021-06-22 ENCOUNTER — Other Ambulatory Visit (HOSPITAL_COMMUNITY): Payer: Self-pay

## 2021-06-24 ENCOUNTER — Ambulatory Visit
Admission: RE | Admit: 2021-06-24 | Discharge: 2021-06-24 | Disposition: A | Discharge status: Home / Self Care | Payer: 59 | Source: Ambulatory Visit | Attending: Internal Medicine | Admitting: Internal Medicine

## 2021-06-24 ENCOUNTER — Other Ambulatory Visit: Payer: Self-pay | Admitting: Internal Medicine

## 2021-06-24 DIAGNOSIS — R9389 Abnormal findings on diagnostic imaging of other specified body structures: Secondary | ICD-10-CM

## 2021-06-24 DIAGNOSIS — Z87891 Personal history of nicotine dependence: Secondary | ICD-10-CM | POA: Diagnosis not present

## 2021-06-25 ENCOUNTER — Other Ambulatory Visit (HOSPITAL_COMMUNITY): Payer: Self-pay

## 2021-06-25 MED ORDER — METFORMIN HCL ER 500 MG PO TB24
ORAL_TABLET | ORAL | 1 refills | Status: AC
  Filled 2021-06-25: qty 180, 90d supply, fill #0
  Filled 2021-09-08: qty 180, 90d supply, fill #1

## 2021-07-06 ENCOUNTER — Other Ambulatory Visit (HOSPITAL_COMMUNITY): Payer: Self-pay

## 2021-07-14 ENCOUNTER — Other Ambulatory Visit (HOSPITAL_COMMUNITY): Payer: Self-pay

## 2021-07-22 ENCOUNTER — Other Ambulatory Visit (HOSPITAL_COMMUNITY): Payer: Self-pay

## 2021-08-06 DIAGNOSIS — Z0000 Encounter for general adult medical examination without abnormal findings: Secondary | ICD-10-CM | POA: Diagnosis not present

## 2021-08-06 DIAGNOSIS — Z1211 Encounter for screening for malignant neoplasm of colon: Secondary | ICD-10-CM | POA: Diagnosis not present

## 2021-08-06 DIAGNOSIS — E049 Nontoxic goiter, unspecified: Secondary | ICD-10-CM | POA: Diagnosis not present

## 2021-08-06 DIAGNOSIS — J439 Emphysema, unspecified: Secondary | ICD-10-CM | POA: Diagnosis not present

## 2021-08-06 DIAGNOSIS — Z13220 Encounter for screening for lipoid disorders: Secondary | ICD-10-CM | POA: Diagnosis not present

## 2021-08-06 DIAGNOSIS — E1169 Type 2 diabetes mellitus with other specified complication: Secondary | ICD-10-CM | POA: Diagnosis not present

## 2021-08-06 DIAGNOSIS — E669 Obesity, unspecified: Secondary | ICD-10-CM | POA: Diagnosis not present

## 2021-08-06 DIAGNOSIS — E1165 Type 2 diabetes mellitus with hyperglycemia: Secondary | ICD-10-CM | POA: Diagnosis not present

## 2021-08-06 DIAGNOSIS — E559 Vitamin D deficiency, unspecified: Secondary | ICD-10-CM | POA: Diagnosis not present

## 2021-08-06 DIAGNOSIS — I700 Atherosclerosis of aorta: Secondary | ICD-10-CM | POA: Diagnosis not present

## 2021-08-19 ENCOUNTER — Other Ambulatory Visit (HOSPITAL_COMMUNITY): Payer: Self-pay

## 2021-08-19 MED ORDER — POTASSIUM CHLORIDE CRYS ER 10 MEQ PO TBCR
EXTENDED_RELEASE_TABLET | ORAL | 0 refills | Status: DC
  Filled 2021-08-19: qty 180, 90d supply, fill #0

## 2021-08-24 ENCOUNTER — Other Ambulatory Visit (HOSPITAL_COMMUNITY): Payer: Self-pay

## 2021-08-26 ENCOUNTER — Other Ambulatory Visit (HOSPITAL_COMMUNITY): Payer: Self-pay

## 2021-09-02 ENCOUNTER — Other Ambulatory Visit (HOSPITAL_COMMUNITY): Payer: Self-pay

## 2021-09-08 ENCOUNTER — Other Ambulatory Visit (HOSPITAL_COMMUNITY): Payer: Self-pay

## 2021-09-09 ENCOUNTER — Other Ambulatory Visit (HOSPITAL_COMMUNITY): Payer: Self-pay

## 2021-09-09 MED ORDER — DILTIAZEM HCL ER COATED BEADS 240 MG PO CP24
240.0000 mg | ORAL_CAPSULE | Freq: Every day | ORAL | 3 refills | Status: AC
  Filled 2021-09-09: qty 90, 90d supply, fill #0

## 2021-09-18 ENCOUNTER — Other Ambulatory Visit (HOSPITAL_COMMUNITY): Payer: Self-pay

## 2021-09-18 MED ORDER — HYDROCHLOROTHIAZIDE 25 MG PO TABS
ORAL_TABLET | ORAL | 3 refills | Status: AC
Start: 2021-09-18 — End: ?
  Filled 2021-09-18: qty 90, 90d supply, fill #0

## 2021-09-30 ENCOUNTER — Other Ambulatory Visit (HOSPITAL_COMMUNITY): Payer: Self-pay

## 2021-10-18 ENCOUNTER — Other Ambulatory Visit (HOSPITAL_COMMUNITY): Payer: Self-pay

## 2021-10-19 ENCOUNTER — Other Ambulatory Visit (HOSPITAL_COMMUNITY): Payer: Self-pay

## 2021-10-19 MED ORDER — METOPROLOL SUCCINATE ER 50 MG PO TB24
ORAL_TABLET | ORAL | 3 refills | Status: AC
  Filled 2021-10-19: qty 180, 90d supply, fill #0
  Filled 2022-04-10: qty 180, 90d supply, fill #1

## 2021-10-19 MED ORDER — ROSUVASTATIN CALCIUM 20 MG PO TABS
20.0000 mg | ORAL_TABLET | Freq: Every day | ORAL | 3 refills | Status: AC
  Filled 2021-10-19: qty 90, 90d supply, fill #0
  Filled 2022-01-21: qty 90, 90d supply, fill #1

## 2021-10-20 ENCOUNTER — Other Ambulatory Visit (HOSPITAL_COMMUNITY): Payer: Self-pay

## 2021-10-20 MED ORDER — POTASSIUM CHLORIDE CRYS ER 10 MEQ PO TBCR
EXTENDED_RELEASE_TABLET | ORAL | 1 refills | Status: AC
  Filled 2021-10-20 – 2022-02-15 (×2): qty 180, 90d supply, fill #0

## 2021-10-22 ENCOUNTER — Other Ambulatory Visit (HOSPITAL_COMMUNITY): Payer: Self-pay

## 2021-10-22 DIAGNOSIS — Z6832 Body mass index (BMI) 32.0-32.9, adult: Secondary | ICD-10-CM | POA: Diagnosis not present

## 2021-10-22 DIAGNOSIS — E041 Nontoxic single thyroid nodule: Secondary | ICD-10-CM | POA: Diagnosis not present

## 2021-10-22 DIAGNOSIS — I700 Atherosclerosis of aorta: Secondary | ICD-10-CM | POA: Diagnosis not present

## 2021-10-22 DIAGNOSIS — E8889 Other specified metabolic disorders: Secondary | ICD-10-CM | POA: Diagnosis not present

## 2021-10-22 DIAGNOSIS — E042 Nontoxic multinodular goiter: Secondary | ICD-10-CM | POA: Diagnosis not present

## 2021-10-22 DIAGNOSIS — E669 Obesity, unspecified: Secondary | ICD-10-CM | POA: Diagnosis not present

## 2021-10-22 DIAGNOSIS — E1169 Type 2 diabetes mellitus with other specified complication: Secondary | ICD-10-CM | POA: Diagnosis not present

## 2021-10-22 DIAGNOSIS — Z1331 Encounter for screening for depression: Secondary | ICD-10-CM | POA: Diagnosis not present

## 2021-10-22 DIAGNOSIS — I10 Essential (primary) hypertension: Secondary | ICD-10-CM | POA: Diagnosis not present

## 2021-10-22 MED ORDER — OZEMPIC (0.25 OR 0.5 MG/DOSE) 2 MG/3ML ~~LOC~~ SOPN
PEN_INJECTOR | SUBCUTANEOUS | 0 refills | Status: DC
  Filled 2021-10-22: qty 3, 42d supply, fill #0

## 2021-10-23 ENCOUNTER — Other Ambulatory Visit (HOSPITAL_COMMUNITY): Payer: Self-pay

## 2021-11-06 ENCOUNTER — Other Ambulatory Visit (HOSPITAL_COMMUNITY): Payer: Self-pay

## 2021-11-09 ENCOUNTER — Other Ambulatory Visit (HOSPITAL_COMMUNITY): Payer: Self-pay

## 2021-11-09 MED ORDER — HYDROCHLOROTHIAZIDE 25 MG PO TABS
25.0000 mg | ORAL_TABLET | Freq: Every morning | ORAL | 3 refills | Status: AC
  Filled 2021-12-17: qty 90, 90d supply, fill #0
  Filled 2022-03-12: qty 90, 90d supply, fill #1

## 2021-11-09 MED ORDER — METOPROLOL SUCCINATE ER 50 MG PO TB24: 100.0000 mg | ORAL_TABLET | Freq: Every day | ORAL | 3 refills | Status: AC

## 2021-11-09 MED ORDER — POTASSIUM CHLORIDE ER 10 MEQ PO TBCR
EXTENDED_RELEASE_TABLET | ORAL | 3 refills | Status: AC
  Filled 2021-11-09: qty 180, 90d supply, fill #0

## 2021-11-09 MED ORDER — METFORMIN HCL ER 500 MG PO TB24
ORAL_TABLET | ORAL | 3 refills | Status: AC
  Filled 2021-12-17: qty 180, 90d supply, fill #0
  Filled 2022-03-12: qty 180, 90d supply, fill #1

## 2021-11-09 MED ORDER — DILTIAZEM HCL ER COATED BEADS 240 MG PO CP24
240.0000 mg | ORAL_CAPSULE | Freq: Every day | ORAL | 3 refills | Status: AC
  Filled 2021-11-09 – 2021-12-09 (×2): qty 90, 90d supply, fill #0
  Filled 2022-02-15 – 2022-02-19 (×2): qty 90, 90d supply, fill #1

## 2021-11-10 ENCOUNTER — Other Ambulatory Visit (HOSPITAL_COMMUNITY): Payer: Self-pay

## 2021-11-16 ENCOUNTER — Other Ambulatory Visit (HOSPITAL_COMMUNITY): Payer: Self-pay

## 2021-11-16 DIAGNOSIS — E669 Obesity, unspecified: Secondary | ICD-10-CM | POA: Diagnosis not present

## 2021-11-16 DIAGNOSIS — Z6831 Body mass index (BMI) 31.0-31.9, adult: Secondary | ICD-10-CM | POA: Diagnosis not present

## 2021-11-16 DIAGNOSIS — E1169 Type 2 diabetes mellitus with other specified complication: Secondary | ICD-10-CM | POA: Diagnosis not present

## 2021-11-16 DIAGNOSIS — I10 Essential (primary) hypertension: Secondary | ICD-10-CM | POA: Diagnosis not present

## 2021-11-16 DIAGNOSIS — R946 Abnormal results of thyroid function studies: Secondary | ICD-10-CM | POA: Diagnosis not present

## 2021-11-16 MED ORDER — OZEMPIC (0.25 OR 0.5 MG/DOSE) 2 MG/3ML ~~LOC~~ SOPN
PEN_INJECTOR | SUBCUTANEOUS | 1 refills | Status: AC
  Filled 2021-11-16: qty 3, 28d supply, fill #0
  Filled 2021-11-16: qty 3, 30d supply, fill #0
  Filled 2021-11-26: qty 3, 28d supply, fill #0
  Filled 2021-12-27: qty 3, 28d supply, fill #1

## 2021-11-17 ENCOUNTER — Other Ambulatory Visit (HOSPITAL_COMMUNITY): Payer: Self-pay

## 2021-11-18 ENCOUNTER — Other Ambulatory Visit (HOSPITAL_COMMUNITY): Payer: Self-pay

## 2021-11-18 MED ORDER — JARDIANCE 10 MG PO TABS
10.0000 mg | ORAL_TABLET | Freq: Every day | ORAL | 5 refills | Status: AC
  Filled 2021-11-18: qty 30, 30d supply, fill #0
  Filled 2021-12-09 – 2021-12-17 (×2): qty 30, 30d supply, fill #1
  Filled 2022-01-13: qty 30, 30d supply, fill #2
  Filled 2022-02-11: qty 30, 30d supply, fill #3
  Filled 2022-03-12: qty 30, 30d supply, fill #4
  Filled 2022-04-10: qty 30, 30d supply, fill #5

## 2021-11-26 ENCOUNTER — Other Ambulatory Visit (HOSPITAL_COMMUNITY): Payer: Self-pay

## 2021-11-28 IMAGING — CR DG CERVICAL SPINE COMPLETE 4+V
6 series · 6 of 6 positions shown · non-contrast
Comparison: None.

CLINICAL DATA: Cervicalgia with left-sided radicular symptoms

EXAM:
CERVICAL SPINE - COMPLETE 4+ VIEW

[w c-spine lat]
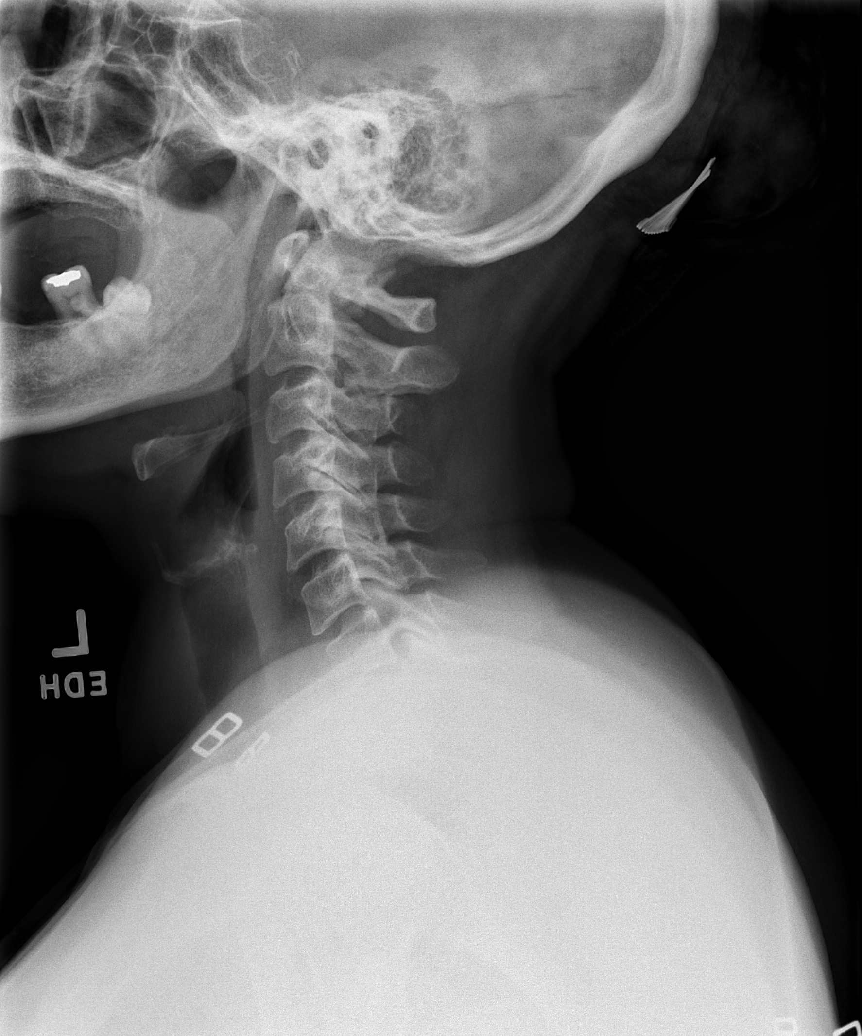

[w c-spine oblique (1 of 2)]
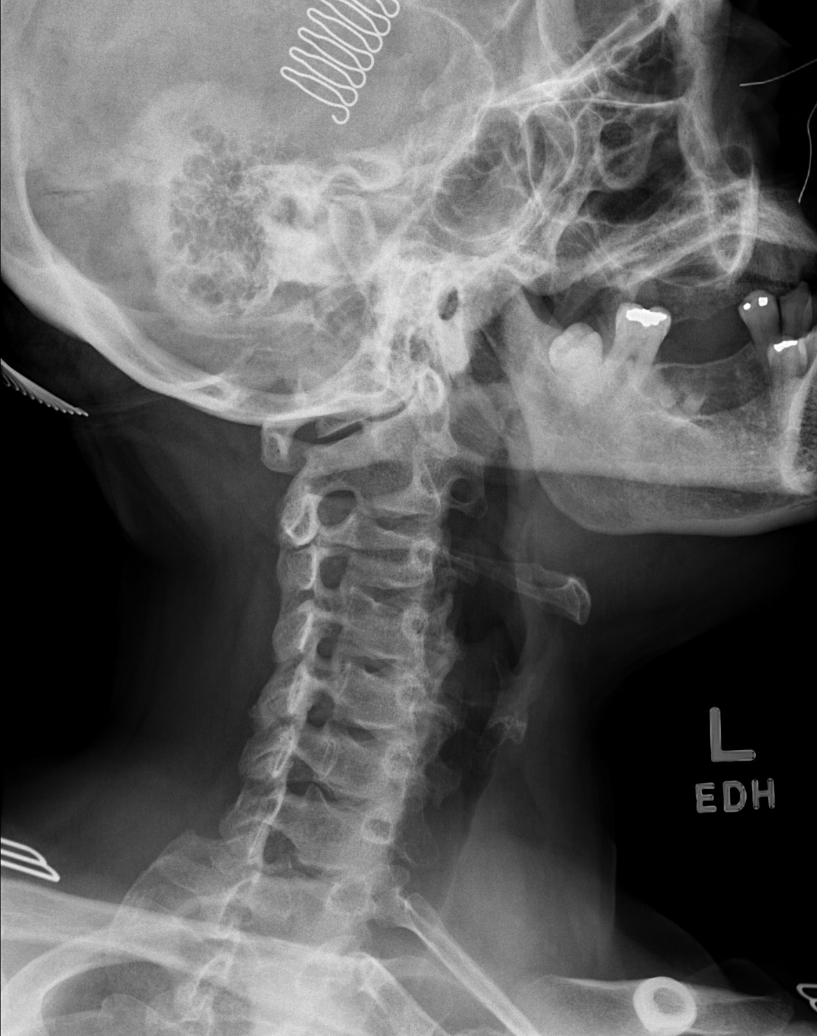

[w c-spine oblique (2 of 2)]
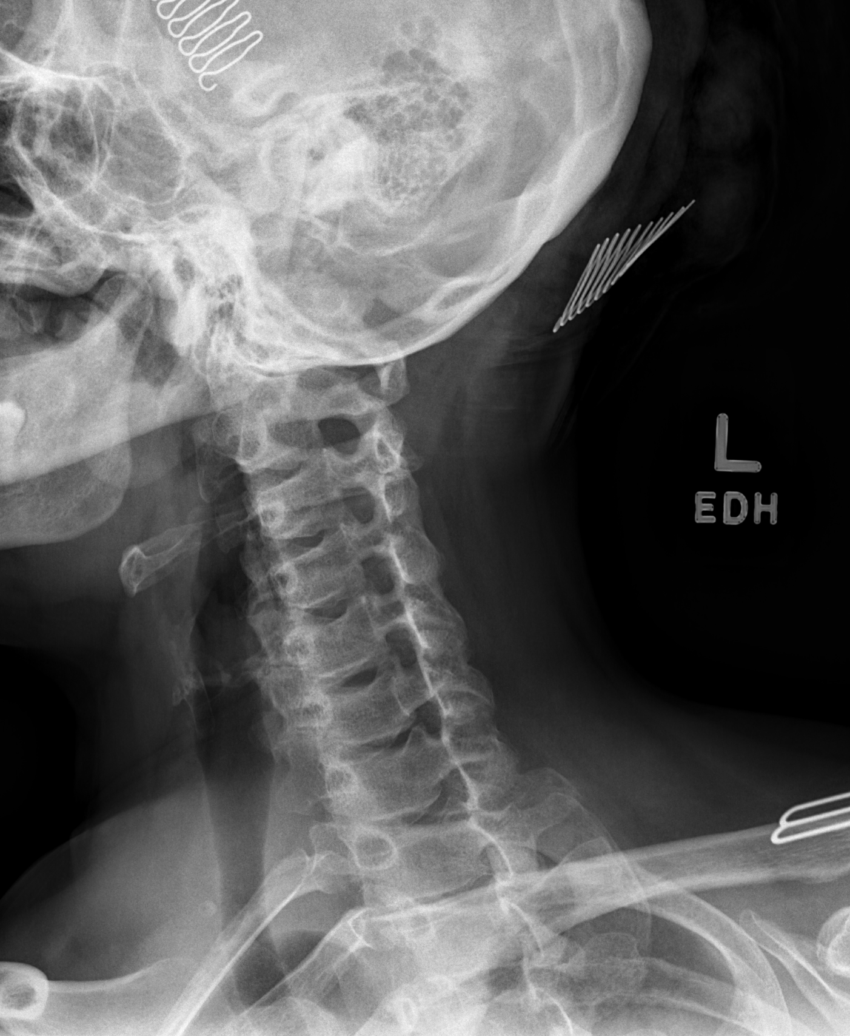

[w c-spine a.p. * (1 of 2)]
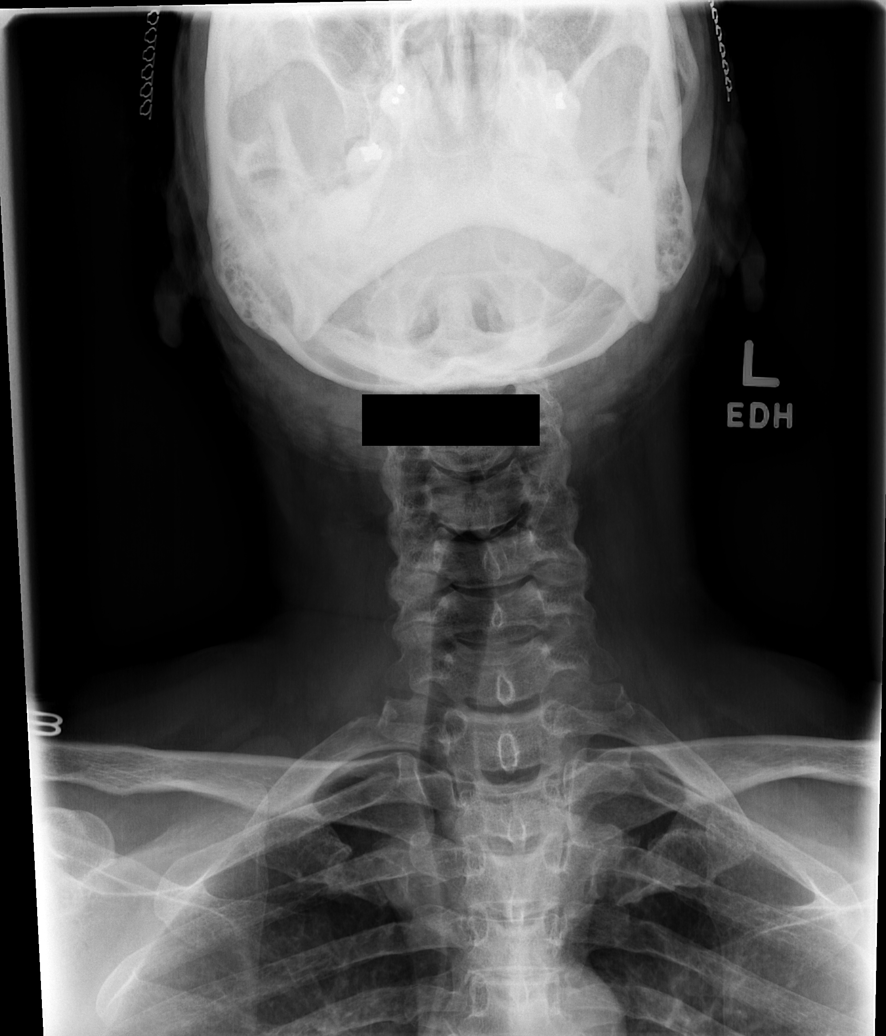

[w c-spine a.p. * (2 of 2)]
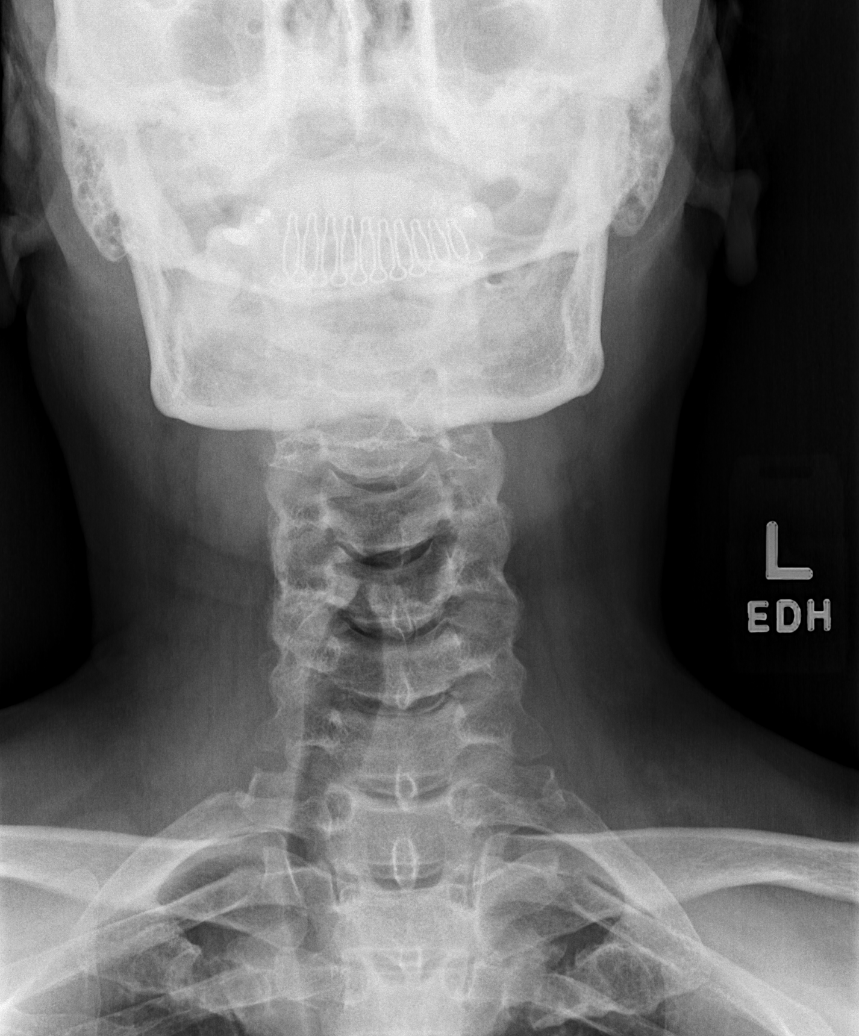

[w c-spine odontoid *]
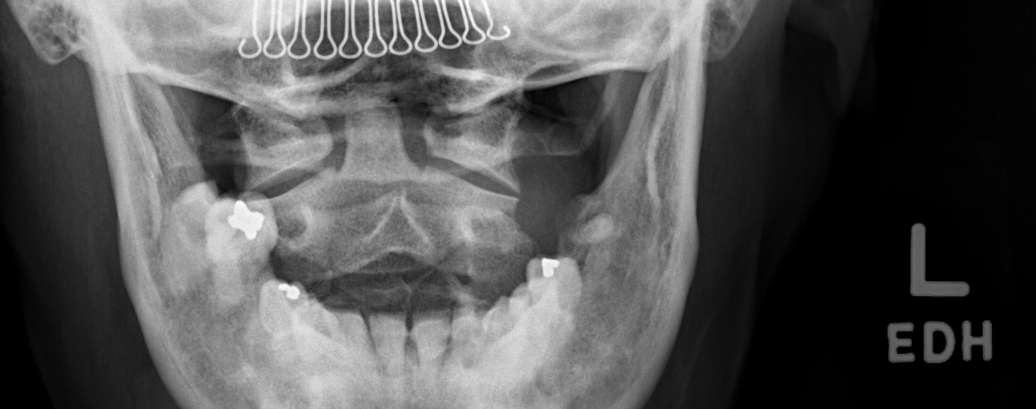

[6 of 6 positions shown; findings below may reference images not displayed]

FINDINGS: Frontal, lateral, open-mouth odontoid, and bilateral oblique views
were obtained. There is no fracture or spondylolisthesis.
Prevertebral soft tissues and predental space regions are normal.
The disc spaces appear normal. There is facet hypertrophy with a
degree of exit foraminal narrowing at C4-5 bilaterally as well as at
C5-6 on the left and to a lesser extent at C3-4 on the right. No
erosive change. Lung apices are clear.
IMPRESSION: Areas of facet osteoarthritic change, most notably at C4-5. No
appreciable disc space narrowing. No fracture or spondylolisthesis.

## 2021-12-09 ENCOUNTER — Other Ambulatory Visit (HOSPITAL_COMMUNITY): Payer: Self-pay

## 2021-12-14 ENCOUNTER — Other Ambulatory Visit (HOSPITAL_COMMUNITY): Payer: Self-pay

## 2021-12-14 DIAGNOSIS — I10 Essential (primary) hypertension: Secondary | ICD-10-CM | POA: Diagnosis not present

## 2021-12-14 DIAGNOSIS — E669 Obesity, unspecified: Secondary | ICD-10-CM | POA: Diagnosis not present

## 2021-12-14 DIAGNOSIS — Z6831 Body mass index (BMI) 31.0-31.9, adult: Secondary | ICD-10-CM | POA: Diagnosis not present

## 2021-12-14 DIAGNOSIS — R946 Abnormal results of thyroid function studies: Secondary | ICD-10-CM | POA: Diagnosis not present

## 2021-12-14 DIAGNOSIS — E1169 Type 2 diabetes mellitus with other specified complication: Secondary | ICD-10-CM | POA: Diagnosis not present

## 2021-12-14 MED ORDER — OZEMPIC (0.25 OR 0.5 MG/DOSE) 2 MG/3ML ~~LOC~~ SOPN
0.5000 mg | PEN_INJECTOR | SUBCUTANEOUS | 1 refills | Status: AC
  Filled 2021-12-14 – 2022-01-24 (×2): qty 3, 28d supply, fill #0
  Filled 2022-02-19: qty 3, 28d supply, fill #1

## 2021-12-15 ENCOUNTER — Other Ambulatory Visit (HOSPITAL_COMMUNITY): Payer: Self-pay

## 2021-12-17 ENCOUNTER — Other Ambulatory Visit (HOSPITAL_COMMUNITY): Payer: Self-pay

## 2021-12-28 ENCOUNTER — Other Ambulatory Visit (HOSPITAL_COMMUNITY): Payer: Self-pay

## 2021-12-29 DIAGNOSIS — E0590 Thyrotoxicosis, unspecified without thyrotoxic crisis or storm: Secondary | ICD-10-CM | POA: Diagnosis not present

## 2021-12-29 DIAGNOSIS — E042 Nontoxic multinodular goiter: Secondary | ICD-10-CM | POA: Diagnosis not present

## 2021-12-30 ENCOUNTER — Other Ambulatory Visit (HOSPITAL_COMMUNITY): Payer: Self-pay | Admitting: Internal Medicine

## 2021-12-30 DIAGNOSIS — E0590 Thyrotoxicosis, unspecified without thyrotoxic crisis or storm: Secondary | ICD-10-CM

## 2022-01-12 DIAGNOSIS — Z6830 Body mass index (BMI) 30.0-30.9, adult: Secondary | ICD-10-CM | POA: Diagnosis not present

## 2022-01-12 DIAGNOSIS — E669 Obesity, unspecified: Secondary | ICD-10-CM | POA: Diagnosis not present

## 2022-01-12 DIAGNOSIS — I10 Essential (primary) hypertension: Secondary | ICD-10-CM | POA: Diagnosis not present

## 2022-01-12 DIAGNOSIS — E1169 Type 2 diabetes mellitus with other specified complication: Secondary | ICD-10-CM | POA: Diagnosis not present

## 2022-01-12 DIAGNOSIS — E0590 Thyrotoxicosis, unspecified without thyrotoxic crisis or storm: Secondary | ICD-10-CM | POA: Diagnosis not present

## 2022-01-13 ENCOUNTER — Other Ambulatory Visit (HOSPITAL_COMMUNITY): Payer: Self-pay

## 2022-01-13 ENCOUNTER — Encounter (HOSPITAL_COMMUNITY)
Admission: RE | Admit: 2022-01-13 | Discharge: 2022-01-13 | Disposition: A | Discharge status: Home / Self Care | Payer: 59 | Source: Ambulatory Visit | Attending: Internal Medicine | Admitting: Internal Medicine

## 2022-01-13 DIAGNOSIS — E0590 Thyrotoxicosis, unspecified without thyrotoxic crisis or storm: Secondary | ICD-10-CM | POA: Insufficient documentation

## 2022-01-13 MED ORDER — SODIUM IODIDE I 131 CAPSULE
9.1000 | Freq: Once | INTRAVENOUS | Status: AC | PRN
  Administered 2022-01-13: 9.1 via ORAL

## 2022-01-14 ENCOUNTER — Encounter (HOSPITAL_COMMUNITY)
Admission: RE | Admit: 2022-01-14 | Discharge: 2022-01-14 | Disposition: A | Discharge status: Home / Self Care | Payer: 59 | Source: Ambulatory Visit | Attending: Internal Medicine | Admitting: Internal Medicine

## 2022-01-14 MED ORDER — SODIUM PERTECHNETATE TC 99M INJECTION
4.2000 | Freq: Once | INTRAVENOUS | Status: AC | PRN
  Administered 2022-01-14: 4.2 via INTRAVENOUS

## 2022-01-15 DIAGNOSIS — E042 Nontoxic multinodular goiter: Secondary | ICD-10-CM | POA: Diagnosis not present

## 2022-01-15 DIAGNOSIS — E0590 Thyrotoxicosis, unspecified without thyrotoxic crisis or storm: Secondary | ICD-10-CM | POA: Diagnosis not present

## 2022-01-19 DIAGNOSIS — E0520 Thyrotoxicosis with toxic multinodular goiter without thyrotoxic crisis or storm: Secondary | ICD-10-CM | POA: Diagnosis not present

## 2022-01-19 DIAGNOSIS — E0590 Thyrotoxicosis, unspecified without thyrotoxic crisis or storm: Secondary | ICD-10-CM | POA: Diagnosis not present

## 2022-01-22 ENCOUNTER — Other Ambulatory Visit (HOSPITAL_COMMUNITY): Payer: Self-pay

## 2022-01-25 ENCOUNTER — Other Ambulatory Visit (HOSPITAL_COMMUNITY): Payer: Self-pay

## 2022-01-28 ENCOUNTER — Other Ambulatory Visit (HOSPITAL_COMMUNITY): Payer: Self-pay | Admitting: Internal Medicine

## 2022-01-28 DIAGNOSIS — E0520 Thyrotoxicosis with toxic multinodular goiter without thyrotoxic crisis or storm: Secondary | ICD-10-CM

## 2022-01-28 DIAGNOSIS — E0590 Thyrotoxicosis, unspecified without thyrotoxic crisis or storm: Secondary | ICD-10-CM

## 2022-02-03 DIAGNOSIS — E0590 Thyrotoxicosis, unspecified without thyrotoxic crisis or storm: Secondary | ICD-10-CM | POA: Diagnosis not present

## 2022-02-03 DIAGNOSIS — E1169 Type 2 diabetes mellitus with other specified complication: Secondary | ICD-10-CM | POA: Diagnosis not present

## 2022-02-03 DIAGNOSIS — E669 Obesity, unspecified: Secondary | ICD-10-CM | POA: Diagnosis not present

## 2022-02-03 DIAGNOSIS — I10 Essential (primary) hypertension: Secondary | ICD-10-CM | POA: Diagnosis not present

## 2022-02-04 NOTE — Written Directive (Addendum)
MOLECULAR IMAGING AND THERAPEUTICS WRITTEN DIRECTIVE   PATIENT NAME: Cindy Clark  PT DOB:   03-31-1965                                              MRN: [id number]  ---------------------------------------------------------------------------------------------------------------------   I-131 WHOLE THYROID THERAPY (NON-CANCER)    RADIOPHARMACEUTICAL:   Iodine-131 Capsule    PRESCRIBED DOSE FOR ADMINISTRATION: 30 mCi   ROUTE OFADMINISTRATION: PO   DIAGNOSIS:  hyperthyroidism    REFERRING PHYSICIAN:Jeffrey Buddy Duty MD   TSH:    12/14/21 TSH (0.05)   PRIOR I-131 THERAPY (Date and Dose): none   PRIOR RADIOLOGY EXAMS (Results and Date): NM THYROID MULT UPTAKE W/IMAGING  Result Date: 01/15/2022 CLINICAL DATA:  Hyperthyroidism EXAM: THYROID SCAN AND UPTAKE - 4 AND 24 HOURS TECHNIQUE: Following the per oral administration of I-131 sodium iodide, the patient returned at 4 and 24 hours and uptake measurements were acquired with the uptake probe centered on the neck. Thyroid imaging was performed following the intravenous administration of Tc-58mPertechnetate. RADIOPHARMACEUTICALS:  4.2 mCi Technetium-967mertechnetate IV and 9.1 microCuries I-131 sodium iodide orally COMPARISON:  Thyroid ultrasound 09/24/2020 FINDINGS: Multiple areas of increased and decreased tracer uptake in both lobes consistent with a multinodular thyroid gland. No dominant hot or cold mass seen. 4 hour I-131 uptake = 9.6% (normal 5-20%) 24 hour I-131 uptake = 24.5% (normal 10-30%) IMPRESSION: Multinodular thyroid gland. Normal 4 hour and 24 hour radio iodine uptakes. Electronically Signed   By: MaLavonia Dana.D.   On: 01/15/2022 11:31   [place]  Result Date: 09/25/2020 CLINICAL DATA:  Goiter. Multinodular thyroid gland. Patient previously underwent biopsy of left upper pole thyroid nodule on 08/31/2018, left lower pole thyroid nodule on 05/18/2017 and inferior isthmic nodule on 08/16/2019. EXAM: THYROID  ULTRASOUND TECHNIQUE: Ultrasound examination of the thyroid gland and adjacent soft tissues was performed. COMPARISON:  Prior thyroid ultrasound 07/27/2019 FINDINGS: Parenchymal Echotexture: Mildly heterogenous Isthmus: 0.4 cm Right lobe: 6.1 x 2.0 x 2.6 cm Left lobe: 9.5 x 6.5 x 4.2 cm _________________________________________________________ Estimated total number of nodules >/= 1 cm: 6-10 Number of spongiform nodules >/=  2 cm not described below (TR1): 0 Number of mixed cystic and solid nodules >/= 1.5 cm not described below (TR[place] 0 _________________________________________________________ Nodule # 1: Previously biopsied 1.5 x 0.9 x 1.1 cm nodule in the thyroid isthmus remains unchanged. Nodule # 2: Small isoechoic solid nodule measuring 1.3 cm within the thyroid isthmus does not meet criteria for further evaluation. Nodule # 3: Isoechoic 1.3 cm nodule in the right lower gland does not meet criteria for further evaluation. Nodule # 4: Prior biopsy: No Location: Left; Inferior Maximum size: 1.3 cm; Other 2 dimensions: 1.1 x 1.2 cm, previously, 1.3 x 1.2 x 1.3 cm Composition: solid/almost completely solid (2) Echogenicity: hypoechoic (2) Shape: not taller-than-wide (0) Margins: smooth (0) Echogenic foci: none (0) ACR TI-RADS total points: 4. ACR TI-RADS risk category:  TR4 (4-6 points). Significant change in size (>/= 20% in two dimensions and minimal increase of 2 mm): No Change in features: No Change in ACR TI-RADS risk category: No ACR TI-RADS recommendations: *Given size (>/= 1 - 1.4 cm) and appearance, a follow-up ultrasound in 1 year should be considered based on TI-RADS criteria. _________________________________________________________ Nodule # 5: Previously biopsied nodule in the left superior gland measures up to 1.7 cm and  demonstrates no significant interval change. Nodule # 7: Previously biopsied nodule in the left inferior gland makes is large, heterogeneous and difficult to measure precisely. The  mass measures approximately 7.4 x 5.4 x 4.8 cm and was previously biopsied. Innumerable additional small thyroid nodules scattered throughout the left mid gland demonstrate no interval change. IMPRESSION: 1. Diffusely enlarged, heterogeneous and multinodular thyroid gland most consistent with multinodular goiter. The vast majority of the thyroid nodules demonstrate relatively homogeneous imaging features. 2. No significant interval change in the size or appearance of previously biopsied nodules in the thyroid isthmus, left upper and left lower gland. 3. Continued stability of TI-RADS category 4 nodule in the right inferior gland. Present examination confirms 3 year stability. Recommend 1 additional ultrasound in February of 2024 to confirm 5 year stability. 4. Numerous additional thyroid nodules again noted which do not meet criteria for further evaluation. The above is in keeping with the ACR TI-RADS recommendations - Anguel Delapena Am Coll Radiol 2017;14:587-595. Electronically Signed   By: Jacqulynn Cadet M.D.   On: 09/25/2020 08:01      ADDITIONAL PHYSICIAN COMMENTS/NOTES  Toxic multinodular goiter. AUTHORIZED USER SIGNATURE & TIME STAMP: Rennis Golden, MD   02/10/22    1:16 PM

## 2022-02-11 ENCOUNTER — Other Ambulatory Visit (HOSPITAL_COMMUNITY): Payer: Self-pay

## 2022-02-12 ENCOUNTER — Other Ambulatory Visit (HOSPITAL_COMMUNITY): Payer: Self-pay

## 2022-02-15 DIAGNOSIS — E1169 Type 2 diabetes mellitus with other specified complication: Secondary | ICD-10-CM | POA: Diagnosis not present

## 2022-02-15 DIAGNOSIS — E669 Obesity, unspecified: Secondary | ICD-10-CM | POA: Diagnosis not present

## 2022-02-15 DIAGNOSIS — I10 Essential (primary) hypertension: Secondary | ICD-10-CM | POA: Diagnosis not present

## 2022-02-15 DIAGNOSIS — Z6830 Body mass index (BMI) 30.0-30.9, adult: Secondary | ICD-10-CM | POA: Diagnosis not present

## 2022-02-15 DIAGNOSIS — E0590 Thyrotoxicosis, unspecified without thyrotoxic crisis or storm: Secondary | ICD-10-CM | POA: Diagnosis not present

## 2022-02-16 ENCOUNTER — Other Ambulatory Visit (HOSPITAL_COMMUNITY): Payer: Self-pay

## 2022-02-19 ENCOUNTER — Other Ambulatory Visit (HOSPITAL_COMMUNITY): Payer: Self-pay

## 2022-02-19 ENCOUNTER — Encounter (HOSPITAL_COMMUNITY)
Admission: RE | Admit: 2022-02-19 | Discharge: 2022-02-19 | Disposition: A | Discharge status: Home / Self Care | Payer: 59 | Source: Ambulatory Visit | Attending: Internal Medicine | Admitting: Internal Medicine

## 2022-02-19 DIAGNOSIS — E0590 Thyrotoxicosis, unspecified without thyrotoxic crisis or storm: Secondary | ICD-10-CM | POA: Insufficient documentation

## 2022-02-19 DIAGNOSIS — E042 Nontoxic multinodular goiter: Secondary | ICD-10-CM | POA: Diagnosis not present

## 2022-02-19 DIAGNOSIS — E0520 Thyrotoxicosis with toxic multinodular goiter without thyrotoxic crisis or storm: Secondary | ICD-10-CM | POA: Insufficient documentation

## 2022-02-19 MED ORDER — SODIUM IODIDE I 131 CAPSULE
31.5000 | Freq: Once | INTRAVENOUS | Status: AC | PRN
  Administered 2022-02-19: 31.5 via ORAL

## 2022-02-20 ENCOUNTER — Other Ambulatory Visit (HOSPITAL_COMMUNITY): Payer: Self-pay

## 2022-03-16 ENCOUNTER — Other Ambulatory Visit (HOSPITAL_COMMUNITY): Payer: Self-pay

## 2022-04-10 ENCOUNTER — Other Ambulatory Visit (HOSPITAL_COMMUNITY): Payer: Self-pay

## 2022-04-20 ENCOUNTER — Other Ambulatory Visit: Payer: Self-pay
